# Patient Record
Sex: Female | Born: 1985 | Race: Black or African American | Hispanic: No | Marital: Married | Smoking: Never smoker
Health system: Southern US, Community
[De-identification: ages and names within clinical notes are randomized; demographics above are authoritative.]

## PROBLEM LIST (undated history)

## (undated) ENCOUNTER — Inpatient Hospital Stay (HOSPITAL_COMMUNITY): Payer: Self-pay

## (undated) DIAGNOSIS — J45909 Unspecified asthma, uncomplicated: Secondary | ICD-10-CM

---

## 2014-03-15 ENCOUNTER — Emergency Department (HOSPITAL_COMMUNITY): Payer: Medicaid Other

## 2014-03-15 ENCOUNTER — Emergency Department (HOSPITAL_COMMUNITY)
Admission: EM | Admit: 2014-03-15 | Discharge: 2014-03-15 | Disposition: A | Discharge status: Home / Self Care | Payer: Medicaid Other | Attending: Emergency Medicine | Admitting: Emergency Medicine

## 2014-03-15 ENCOUNTER — Encounter (HOSPITAL_COMMUNITY): Payer: Self-pay | Admitting: Cardiology

## 2014-03-15 DIAGNOSIS — K5900 Constipation, unspecified: Secondary | ICD-10-CM

## 2014-03-15 DIAGNOSIS — N390 Urinary tract infection, site not specified: Secondary | ICD-10-CM

## 2014-03-15 DIAGNOSIS — M25551 Pain in right hip: Secondary | ICD-10-CM | POA: Insufficient documentation

## 2014-03-15 DIAGNOSIS — R51 Headache: Secondary | ICD-10-CM | POA: Diagnosis not present

## 2014-03-15 DIAGNOSIS — G8929 Other chronic pain: Secondary | ICD-10-CM

## 2014-03-15 DIAGNOSIS — M545 Low back pain: Secondary | ICD-10-CM | POA: Insufficient documentation

## 2014-03-15 DIAGNOSIS — Z3202 Encounter for pregnancy test, result negative: Secondary | ICD-10-CM | POA: Insufficient documentation

## 2014-03-15 DIAGNOSIS — M549 Dorsalgia, unspecified: Secondary | ICD-10-CM

## 2014-03-15 DIAGNOSIS — R109 Unspecified abdominal pain: Secondary | ICD-10-CM | POA: Diagnosis present

## 2014-03-15 LAB — COMPREHENSIVE METABOLIC PANEL
ALT: 13 U/L (ref 0–35)
AST: 15 U/L (ref 0–37)
Albumin: 3.9 g/dL (ref 3.5–5.2)
Alkaline Phosphatase: 48 U/L (ref 39–117)
Anion gap: 6 (ref 5–15)
BUN: 13 mg/dL (ref 6–23)
CALCIUM: 9.3 mg/dL (ref 8.4–10.5)
CO2: 28 mmol/L (ref 19–32)
Chloride: 105 mEq/L (ref 96–112)
Creatinine, Ser: 0.52 mg/dL (ref 0.50–1.10)
GFR calc Af Amer: >90 mL/min (ref >90)
GFR calc non Af Amer: >90 mL/min (ref >90)
Glucose, Bld: 94 mg/dL (ref 70–99)
Potassium: 4.3 mmol/L (ref 3.5–5.1)
SODIUM: 139 mmol/L (ref 135–145)
TOTAL PROTEIN: 6.6 g/dL (ref 6.0–8.3)
Total Bilirubin: 0.4 mg/dL (ref 0.3–1.2)

## 2014-03-15 LAB — CBC WITH DIFFERENTIAL/PLATELET
BASOS ABS: 0.1 10*3/uL (ref 0.0–0.1)
Basophils Relative: 1 % (ref 0–1)
EOS ABS: 0.2 10*3/uL (ref 0.0–0.7)
EOS PCT: 2 % (ref 0–5)
HCT: 40.8 % (ref 36.0–46.0)
Hemoglobin: 13.1 g/dL (ref 12.0–15.0)
LYMPHS PCT: 46 % (ref 12–46)
Lymphs Abs: 3.1 10*3/uL (ref 0.7–4.0)
MCH: 27.8 pg (ref 26.0–34.0)
MCHC: 32.1 g/dL (ref 30.0–36.0)
MCV: 86.4 fL (ref 78.0–100.0)
Monocytes Absolute: 0.6 10*3/uL (ref 0.1–1.0)
Monocytes Relative: 8 % (ref 3–12)
Neutro Abs: 2.9 10*3/uL (ref 1.7–7.7)
Neutrophils Relative %: 43 % (ref 43–77)
Platelets: 367 10*3/uL (ref 150–400)
RBC: 4.72 MIL/uL (ref 3.87–5.11)
RDW: 12.9 % (ref 11.5–15.5)
WBC: 6.8 10*3/uL (ref 4.0–10.5)

## 2014-03-15 LAB — URINALYSIS, ROUTINE W REFLEX MICROSCOPIC
Bilirubin Urine: NEGATIVE
GLUCOSE, UA: NEGATIVE mg/dL
Ketones, ur: NEGATIVE mg/dL
Nitrite: NEGATIVE
PH: 7 (ref 5.0–8.0)
Protein, ur: NEGATIVE mg/dL
SPECIFIC GRAVITY, URINE: 1.026 (ref 1.005–1.030)
Urobilinogen, UA: 0.2 mg/dL (ref 0.0–1.0)

## 2014-03-15 LAB — URINE MICROSCOPIC-ADD ON

## 2014-03-15 LAB — PREGNANCY, URINE: Preg Test, Ur: NEGATIVE

## 2014-03-15 MED ORDER — CEPHALEXIN 500 MG PO CAPS: 500.0000 mg | ORAL_CAPSULE | Freq: Two times a day (BID) | ORAL | Status: DC

## 2014-03-15 NOTE — Discharge Instructions (Signed)
Please call your doctor for a followup appointment within 24-48 hours. When you talk to your doctor please let them know that you were seen in the emergency department and have them acquire all of your records so that they can discuss the findings with you and formulate a treatment plan to fully care for your new and ongoing problems. Please call and set-up an appointment with [facility]  Please rest and stay hydrated Please take antibiotics as prescribed and on a full stomach  Please continue to monitor symptoms closely and if symptoms are to worsen or change (fever greater than 101, chills, sweating, nausea, vomiting, chest pain, shortness of breathe, difficulty breathing, weakness, numbness, tingling, worsening or changes to pain pattern, fall, injury, inability to control urine or bowel movements) please report back to the Emergency Department immediately.   Urinary Tract Infection Urinary tract infections (UTIs) can develop anywhere along your urinary tract. Your urinary tract is your body's drainage system for removing wastes and extra water. Your urinary tract includes two kidneys, two ureters, a bladder, and a urethra. Your kidneys are a pair of bean-shaped organs. Each kidney is about the size of your fist. They are located below your ribs, one on each side of your spine. CAUSES Infections are caused by microbes, which are microscopic organisms, including fungi, viruses, and bacteria. These organisms are so small that they can only be seen through a microscope. Bacteria are the microbes that most commonly cause UTIs. SYMPTOMS  Symptoms of UTIs may vary by age and gender of the patient and by the location of the infection. Symptoms in young women typically include a frequent and intense urge to urinate and a painful, burning feeling in the bladder or urethra during urination. Older women and men are more likely to be tired, shaky, and weak and have muscle aches and abdominal pain.  A fever may mean the infection is in your kidneys. Other symptoms of a kidney infection include pain in your back or sides below the ribs, nausea, and vomiting. DIAGNOSIS To diagnose a UTI, your caregiver will ask you about your symptoms. Your caregiver also will ask to provide a urine sample. The urine sample will be tested for bacteria and white blood cells. White blood cells are made by your body to help fight infection. TREATMENT  Typically, UTIs can be treated with medication. Because most UTIs are caused by a bacterial infection, they usually can be treated with the use of antibiotics. The choice of antibiotic and length of treatment depend on your symptoms and the type of bacteria causing your infection. HOME CARE INSTRUCTIONS  If you were prescribed antibiotics, take them exactly as your caregiver instructs you. Finish the medication even if you feel better after you have only taken some of the medication.  Drink enough water and fluids to keep your urine clear or pale yellow.  Avoid caffeine, tea, and carbonated beverages. They tend to irritate your bladder.  Empty your bladder often. Avoid holding urine for long periods of time.  Empty your bladder before and after sexual intercourse.  After a bowel movement, women should cleanse from front to back. Use each tissue only once. SEEK MEDICAL CARE IF:   You have back pain.  You develop a fever.  Your symptoms do not begin to resolve within 3 days. SEEK IMMEDIATE MEDICAL CARE IF:   You have severe back pain or lower abdominal pain.  You develop chills.  You have nausea or vomiting.  You have  continued burning or discomfort with urination. MAKE SURE YOU:   Understand these instructions.  Will watch your condition.  Will get help right away if you are not doing well or get worse. Document Released: 12/19/2004 Document Revised: 09/10/2011 Document Reviewed: 04/19/2011 [facility] Patient Information 2015 [place], [place]. This  information is not intended to replace advice given to you by your health care provider. Make sure you discuss any questions you have with your health care provider.  Constipation Constipation is when a person has fewer than three bowel movements a week, has difficulty having a bowel movement, or has stools that are dry, hard, or larger than normal. As people grow older, constipation is more common. If you try to fix constipation with medicines that make you have a bowel movement (laxatives), the problem may get worse. Long-term laxative use may cause the muscles of the colon to become weak. A low-fiber diet, not taking in enough fluids, and taking certain medicines may make constipation worse.  CAUSES   Certain medicines, such as antidepressants, pain medicine, iron supplements, antacids, and water pills.   Certain diseases, such as diabetes, irritable bowel syndrome (IBS), thyroid disease, or depression.   Not drinking enough water.   Not eating enough fiber-rich foods.   Stress or travel.   Lack of physical activity or exercise.   Ignoring the urge to have a bowel movement.   Using laxatives too much.  SIGNS AND SYMPTOMS   Having fewer than three bowel movements a week.   Straining to have a bowel movement.   Having stools that are hard, dry, or larger than normal.   Feeling full or bloated.   Pain in the lower abdomen.   Not feeling relief after having a bowel movement.  DIAGNOSIS  Your health care provider will take a medical history and perform a physical exam. Further testing may be done for severe constipation. Some tests may include:  A barium enema X-ray to examine your rectum, colon, and, sometimes, your small intestine.   A sigmoidoscopy to examine your lower colon.   A colonoscopy to examine your entire colon. TREATMENT  Treatment will depend on the severity of your constipation and what is causing it. Some dietary treatments include drinking  more fluids and eating more fiber-rich foods. Lifestyle treatments may include regular exercise. If these diet and lifestyle recommendations do not help, your health care provider may recommend taking over-the-counter laxative medicines to help you have bowel movements. Prescription medicines may be prescribed if over-the-counter medicines do not work.  HOME CARE INSTRUCTIONS   Eat foods that have a lot of fiber, such as fruits, vegetables, whole grains, and beans.  Limit foods high in fat and processed sugars, such as french fries, hamburgers, cookies, candies, and soda.   A fiber supplement may be added to your diet if you cannot get enough fiber from foods.   Drink enough fluids to keep your urine clear or pale yellow.   Exercise regularly or as directed by your health care provider.   Go to the restroom when you have the urge to go. Do not hold it.   Only take over-the-counter or prescription medicines as directed by your health care provider. Do not take other medicines for constipation without talking to your health care provider first.  SEEK IMMEDIATE MEDICAL CARE IF:   You have bright red blood in your stool.   Your constipation lasts for more than 4 days or gets worse.   You have abdominal or rectal  pain.   You have thin, pencil-like stools.   You have unexplained weight loss. MAKE SURE YOU:   Understand these instructions.  Will watch your condition.  Will get help right away if you are not doing well or get worse. Document Released: 12/08/2003 Document Revised: 03/16/2013 Document Reviewed: 12/21/2012 [facility] Patient Information 2015 [place], [place]. This information is not intended to replace advice given to you by your health care provider. Make sure you discuss any questions you have with your health care provider.    Emergency Department Resource Guide 1) Find a Doctor and Pay Out of Pocket Although you won't have to find out who is covered by your  insurance plan, it is a good idea to ask around and get recommendations. You will then need to call the office and see if the doctor you have chosen will accept you as a new patient and what types of options they offer for patients who are self-pay. Some doctors offer discounts or will set up payment plans for their patients who do not have insurance, but you will need to ask so you aren't surprised when you get to your appointment.  2) Contact Your Local Health Department Not all health departments have doctors that can see patients for sick visits, but many do, so it is worth a call to see if yours does. If you don't know where your local health department is, you can check in your phone book. The CDC also has a tool to help you locate your state's health department, and many state websites also have listings of all of their local health departments.  3) Find a Walk-in Clinic If your illness is not likely to be very severe or complicated, you may want to try a walk in clinic. These are popping up all over the country in pharmacies, drugstores, and shopping centers. They're usually staffed by nurse practitioners or physician assistants that have been trained to treat common illnesses and complaints. They're usually fairly quick and inexpensive. However, if you have serious medical issues or chronic medical problems, these are probably not your best option.  No Primary Care Doctor: - Call Health Connect at  [phone] - they can help you locate a primary care doctor that  accepts your insurance, provides certain services, etc. - Physician Referral Service- [phone]  Chronic Pain Problems: Organization         Address  Phone   Notes  [facility]  [phone] Patients need to be referred by their primary care doctor.   Medication Assistance: Organization         Address  Phone   Notes  [facility] Medication [facility] [address] [place] [place], [place] [address] [phone] --Must be a resident of [facility] -- Must have NO insurance coverage whatsoever (no Medicaid/ Medicare, etc.) -- The pt. MUST have a primary care doctor that directs their care regularly and follows them in the community   [facility]ist  [phone]   [employer]  [phone]    Agencies that provide inexpensive medical care: Organization         Address  Phone   Notes  [facility]  [phone]   [facility]    [phone]   [facility] [address] [place], [place] [address] [phone]   [facility] [address] [place] [phone]   Planned Parenthood    (  [phone]   [facility]    [phone]   [facility]  [address], [place] Phone:  [phone], Fax:  [phone] Hours of Operation:  9 am - 6 pm, M-F.  Also accepts Medicaid/Medicare and self-pay.  [facility]  [address], [place] Phone: [phone], Fax: [phone]. Hours of Operation:  8:30 am - 5:30 pm, M-F.  Also accepts Medicaid and self-pay.  [facility] [place] [address], [place] Phone: [phone]   [facility] [address] Natasha Bence [place], [place] [phone], Ext. [phone] Mondays & Thursdays: 7-9 AM.  First 15 patients are seen on a first come, first serve basis.    Medicaid-accepting [facility] Providers:  Organization         Address  Phone   Notes  [facility] [address], [place] [phone] Also accepts self-pay patients.  [facility] [address] Laurell Josephs [place], [place]  [phone]   [facility] [address], [place] [phone]   [facility] [address] [place] [phone]   Renaye Rakers [address], [place]   [phone] Only accepts [place] patients after they have their name applied to their card.   Self-Pay (no insurance) in [facility]:  Organization         Address  Phone   Notes  Sickle Cell Patients, [facility] [address] [place], [place] [phone]   [facility] [address] [place], [place] [phone]   [facility] [place]  [address], [place] [phone]   [facility]/Dr. Osei-Bonsu  [address] [place] or [address], [place] [phone] Phone number for both [place] and [place] locations is the same.  [facility] [address], [place] [phone]   [facility] [address], [place] or [address] [phone]   [facility] [address] [place] [phone], phone; [phone], fax Sees patients 1st and 3rd Saturday of every month.  Must not qualify for public or private insurance (i.e. Medicaid, Medicare, [place] Health Choice, Veterans' Benefits)  Household income should be no more than 200% of the poverty level The clinic cannot treat you if you are pregnant or think you are pregnant  Sexually transmitted diseases are not treated at the clinic.    Dental Care: Organization         Address  Phone  Notes  [facility] Department of [facility] [address] [place], [place] [phone] Accepts children up to age 57 who are enrolled in [place] or [place] Health Choice; pregnant women with a Medicaid card; and children who have applied for Medicaid or [place] Health Choice, but were declined, whose parents can pay a reduced fee at time of service.  [facility] Department of [facility]  [address], [place] [phone] Accepts children up to age 57 who are enrolled  in [place] or [place] Health Choice; pregnant women with a Medicaid card; and children who have applied for Medicaid or  Health Choice, but were declined, whose parents can pay a  reduced fee at time of service.  [facility]  [address], [place] [phone] Patients are seen by appointment only. Walk-ins are not accepted. [facility] will see patients 27 years of age and older. Monday - Tuesday (8am-5pm) Most Wednesdays (8:30-5pm) $30 per visit, cash only  [facility]  [address], [place] [phone] Patients are seen by appointment only. Walk-ins are not accepted. [facility] will see patients 27 years of age and older. One Wednesday Evening (Monthly: Volunteer Based).  $30 per visit, cash only  [employer] [facility]  [phone] for adults; Children under age 214, call Graduate Pediatric Dentistry at [phone]) [phone]. Children aged 624-14, please call [phone] to request a pediatric application.  Dental services are provided in all areas of dental care including fillings, crowns and bridges, complete and partial dentures, implants, gum treatment, root canals, and extractions. Preventive care is also provided. Treatment is provided to both adults and children. Patients are selected via a lottery and there is often a waiting list.   [facility] [address], [place]  [phone] [email or web address]   [facility] [address], [place], [place] [phone], Ext. [phone] Second and Fourth Thursday of each month, opens at 6:30 AM; Clinic ends at 9 AM.  Patients are seen on a first-come first-served basis, and a limited number are seen during each clinic.   [facility]  [address], [place], [place] [phone]   Eligibility Requirements You must have lived in [place], [place], or [place] counties for at least the last three months.   You cannot be  eligible for state or federal sponsored [place] Cityhealthcare insurance, including CIGNAVeterans Administration, [place]icaid, or [employer]icare.   You generally cannot be eligible for healthcare insurance through your employer.    How to apply: Eligibility screenings are held every Tuesday and Wednesday afternoon from 1:00 pm until 4:00 pm. You do not need an appointment for the interview!  [facility] [address], [place], [place] [phone]   [facility]  [phone]   [facility]  [phone]   [facility]  [phone]    Behavioral Health Resources in the Community: Intensive Outpatient Programs Organization         Address  Phone  Notes  [facility] [address], [place], [place] [phone]   [facility] [address], [place], [place] [phone]   [facility] [address], [place], [place]  [phone]   [facility] [address],  [place], [place] [phone] or [phone]   Substance Abuse Resources Organization         Address  Phone  Notes  [facility]  [phone]   [facility]  [phone]   [facility]  [phone]   [facility]  [phone]   Residential & Outpatient Substance Abuse Program  [phone]   Psychological Services Organization         Address  Phone  Notes  [facility]  [phone]   [facility]  [phone]   [facility] [address], [place] [phone] or [phone]    Mobile Crisis Teams Organization         Address  Phone  Notes  [facility], Mobile Crisis Care Unit  [phone]  [facility]  [address] [place], [place] [phone]   [facility] [address] [place] [phone]    Self-Help/Support Groups Organization          Address  Phone             Notes  [facility] [place] - variety of support groups  [phone]- [phone] Call for more information  Narcotics Anonymous (NA), [facility] [address], [place]  2 meetings at this location   Statisticianesidential Treatment Programs Organization         Address  Phone  Notes  [facility] [address],    [place]  [phone]   [facility]  [address], [place] [phone], [place], [place] [phone]   [facility] [address], [place] [phone] Admissions: 8am-3pm M-F  [facility] [address]    [place], [place] [phone]   [facility] [address], [place], [place] [phone]   [facility] [address]  [place], [place] [phone]   [facility] - Intensive Outpatient [address], [place], [place] [phone]   [facility] ([facility] [address]  [place], [place] [phone] or [phone]   [facility] ([facility]) [address] [place], [place] [phone] Accepts Medicaid  [facility] [address]  [place] [phone] Substance Abuse/Addiction Treatment   [facility] Resources Organization         Address  Phone  Notes  [facility]  [phone]) [phone]   Angie FavaJulie Brannon, PhD [address], [address] [place], [place]   [phone] or [phone]   [facility]   [address] [place], [place] [phone]   [facility] [address], [place], [place] [phone] Insurance/Medicaid/sponsorship through [facility]  [facility] [address]                                    [place], [place] [phone] Therapy/tele-psych/case  [facility] [address]   [place], [place] [phone]    Dr. Lolly MustacheArfeen  [phone]   [facility]  [employer]  [facility] 1) [address], [place] 2) [address], [place] 3)  [address], [place] [phone]  [phone]   [facility] Child Abuse Hotline [phone]) [phone] or [phone] (After Hours)

## 2014-03-15 NOTE — ED Notes (Signed)
Patient also reports she has had a headache for the past 2 days but has not tried any otc meds for it. She denies any n/v/d. Denies fever.
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
## 2014-03-15 NOTE — ED Notes (Signed)
Patient transported to X-ray 

## 2014-03-15 NOTE — ED Notes (Signed)
Pt reports right flank pain that started this morning about 0700. Reports a headache, but denies any n/v or painful urination.

## 2014-03-15 NOTE — ED Notes (Signed)
Case Manager Number is [phone].  Ms. Christina Vega.  She will  be picking her up.

## 2014-03-15 NOTE — ED Notes (Signed)
Patient is alert and orientedx4.  Patient was explained discharge instructions and they understood them with no questions.  The patient's husband, Mammie RussianJuma Juma is taking her home.

## 2014-03-15 NOTE — ED Notes (Signed)
Family at bedside. 

## 2014-03-15 NOTE — ED Notes (Addendum)
CALLED LAB TO CHECK ON PREG RESULT. HAS BEEN PENDING FOR ABOUT 30 MIN. LAB ADVISES RESULT IS NEGATIVE. WILL POST RESULT TO CHART

## 2014-03-16 LAB — URINE CULTURE
Colony Count: >=100000
SPECIAL REQUESTS: NORMAL

## 2014-07-04 ENCOUNTER — Encounter: Payer: Self-pay | Admitting: Family Medicine

## 2014-07-04 ENCOUNTER — Ambulatory Visit (INDEPENDENT_AMBULATORY_CARE_PROVIDER_SITE_OTHER): Payer: Medicaid Other | Admitting: Family Medicine

## 2014-07-04 VITALS — BP 109/50 | HR 66 | Temp 97.8°F | Ht 65.0 in | Wt 191.6 lb

## 2014-07-04 DIAGNOSIS — Z008 Encounter for other general examination: Secondary | ICD-10-CM | POA: Diagnosis not present

## 2014-07-04 DIAGNOSIS — L905 Scar conditions and fibrosis of skin: Secondary | ICD-10-CM | POA: Diagnosis not present

## 2014-07-04 DIAGNOSIS — Z23 Encounter for immunization: Secondary | ICD-10-CM

## 2014-07-04 DIAGNOSIS — Z789 Other specified health status: Secondary | ICD-10-CM | POA: Insufficient documentation

## 2014-07-04 DIAGNOSIS — F515 Nightmare disorder: Secondary | ICD-10-CM

## 2014-07-04 DIAGNOSIS — Z0289 Encounter for other administrative examinations: Secondary | ICD-10-CM | POA: Insufficient documentation

## 2014-07-04 DIAGNOSIS — R52 Pain, unspecified: Secondary | ICD-10-CM

## 2014-07-04 MED ORDER — ACETAMINOPHEN 500 MG PO TABS: 500.0000 mg | ORAL_TABLET | Freq: Four times a day (QID) | ORAL | Status: DC | PRN

## 2014-07-04 NOTE — Assessment & Plan Note (Addendum)
Pain over c-section scar. Trial OTC pain management with tylenol, pt states had good relief in past with belt, given name of maternity belt on AVS to pay out of pocket at pharmacy. Abdominal exam benign today.  No evidence of intra-abdominal pathology.

## 2014-07-04 NOTE — Patient Instructions (Signed)
Abdominal pain:  Maternity belt, you will need to buy this on your own as insurance won't cover it. The pharmacy or Home health equipment store should have this.  Tylenol (Acetaminophen or Paracetamol): Take 500mg  every 6 hours as needed for pain  If you continue to have pain schedule another appointment.
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
## 2014-07-04 NOTE — Assessment & Plan Note (Signed)
Seen at health department, will need to request health records.

## 2014-07-05 DIAGNOSIS — F515 Nightmare disorder: Secondary | ICD-10-CM | POA: Insufficient documentation
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
## 2014-07-28 ENCOUNTER — Ambulatory Visit: Payer: Medicaid Other | Admitting: Family Medicine

## 2014-07-29 ENCOUNTER — Encounter: Payer: Self-pay | Admitting: Family Medicine

## 2014-07-29 ENCOUNTER — Ambulatory Visit (INDEPENDENT_AMBULATORY_CARE_PROVIDER_SITE_OTHER): Payer: Medicaid Other | Admitting: Family Medicine

## 2014-07-29 VITALS — BP 106/54 | HR 70 | Temp 98.5°F | Wt 193.0 lb

## 2014-07-29 DIAGNOSIS — R201 Hypoesthesia of skin: Secondary | ICD-10-CM | POA: Diagnosis present

## 2014-07-29 LAB — VITAMIN B12: Vitamin B-12: 561 pg/mL (ref 211–911)

## 2014-07-29 LAB — FOLATE: Folate: >20 ng/mL

## 2014-07-29 LAB — TSH: TSH: 0.862 u[IU]/mL (ref 0.350–4.500)

## 2014-07-29 MED ORDER — GABAPENTIN 100 MG PO CAPS: 100.0000 mg | ORAL_CAPSULE | Freq: Three times a day (TID) | ORAL | Status: DC

## 2014-07-29 NOTE — Assessment & Plan Note (Signed)
B/L Feet with subjective hypoesthesias/paresthesias.  However, she is able to walk along with negative Romberg which means she must feel something.  Negative Tarsal Tunnel as well - lab work to include RPR, B12, A1C, TSH to r/o organic causes - Start on neurontin 100 mg TID and if no improvement in 1-2 weeks, consider EMG.

## 2014-07-29 NOTE — Progress Notes (Signed)
I was preceptor for this office visit.  

## 2014-07-29 NOTE — Progress Notes (Signed)
Christina BoroughsRaniya Vega is a 29 y.o. female who presents today for B/L foot numbness  Foot hypoesthesias/paresthesias - States this has been ongoing now for two weeks.  She denies ever having this before, has not tried anything for this, denies weakness.  States that she gets "full foot numbness/loss of sensation" in episodes throughout the days.  Has not gotten worse but has not gotten better.  Denies back pain, muscle weakness, bowel/bladder issues, polyuria/polydipsia, fall issues.   No past medical history on file.  History  Smoking status  . Never Smoker   Smokeless tobacco  . Not on file    No family history on file.  Current Outpatient Prescriptions on File Prior to Visit  Medication Sig Dispense Refill  . acetaminophen (TYLENOL) 500 MG tablet Take 1 tablet (500 mg total) by mouth every 6 (six) hours as needed for mild pain or moderate pain. 90 tablet 0   No current facility-administered medications on file prior to visit.    ROS: Per HPI.  All other systems reviewed and are negative.   Physical Exam Filed Vitals:   07/29/14 1020  BP: 106/54  Pulse: 70  Temp: 98.5 F (36.9 C)    Physical Examination: General appearance - alert, well appearing, and in no distress Neurological - AAO x 3.  Negative Romberg/Pronator drift.  MS 5/5 B/L LE.  Subjective loss of sensation L4-S1 dermatomes B/L feet.  Negative Tarsal Tunnel Tinel's B/L.  No ataxia or gait disturbances.  Musculoskeletal - no joint tenderness, deformity or swelling Skin: No evidence of abnormal rashes or skin changes on ankles/feet    Chemistry      Component Value Date/Time   NA 139 03/15/2014 1328   K 4.3 03/15/2014 1328   CL 105 03/15/2014 1328   CO2 28 03/15/2014 1328   BUN 13 03/15/2014 1328   CREATININE 0.52 03/15/2014 1328      Component Value Date/Time   CALCIUM 9.3 03/15/2014 1328   ALKPHOS 48 03/15/2014 1328   AST 15 03/15/2014 1328   ALT 13 03/15/2014 1328   BILITOT 0.4 03/15/2014 1328      Lab  Results  Component Value Date   WBC 6.8 03/15/2014   HGB 13.1 03/15/2014   HCT 40.8 03/15/2014   MCV 86.4 03/15/2014   PLT 367 03/15/2014

## 2014-07-30 LAB — RPR

## 2014-08-01 ENCOUNTER — Encounter: Payer: Self-pay | Admitting: Family Medicine

## 2014-08-10 ENCOUNTER — Encounter: Payer: Self-pay | Admitting: Family Medicine

## 2014-08-10 ENCOUNTER — Ambulatory Visit (INDEPENDENT_AMBULATORY_CARE_PROVIDER_SITE_OTHER): Payer: Medicaid Other | Admitting: Family Medicine

## 2014-08-10 VITALS — BP 110/61 | HR 65 | Temp 97.6°F | Ht 65.0 in | Wt 198.0 lb

## 2014-08-10 DIAGNOSIS — R201 Hypoesthesia of skin: Secondary | ICD-10-CM

## 2014-08-10 DIAGNOSIS — G629 Polyneuropathy, unspecified: Secondary | ICD-10-CM | POA: Diagnosis not present

## 2014-08-10 LAB — HIV ANTIBODY (ROUTINE TESTING W REFLEX): HIV 1&2 Ab, 4th Generation: NONREACTIVE

## 2014-08-10 LAB — POCT GLYCOSYLATED HEMOGLOBIN (HGB A1C): Hemoglobin A1C: 5.6

## 2014-08-10 NOTE — Patient Instructions (Addendum)
For the foot numbness, I am putting in a referral to neurology for an EMG and you should receive a phone call to schedule this appointment. We are also checking some lab work today and I will call if it is not normal. Otherwise I will send a letter. For the mild leg swelling sensation, keep your legs elevated a few times a day to help with swelling, and consider buying compression socks. This may help with some of the numbness. Schedule follow-up appointment with us for a few days to one week after your EMG. If you have severe pain, weakness, or other major concerns, seek immediate care. I was unable to find any vitamin D results. It may be worthwhile to make sure that Dr. Waynetta SandyWight is notified of this and has any lab work and evaluation they did at the health department.  Christina SingletonMaria T Jatziri Goffredo, MD

## 2014-08-10 NOTE — Progress Notes (Addendum)
Patient ID: Jennye Boroughsaniya Klepacki, female   DOB: 12/29/1985, 29 y.o.   MRN: [id number] Subjective:   CC: Evaluation of lower extremity numbness  HPI:   Patient presents for follow-up of lower externally numbness. Patient presented to clinic 07/29/2014 for bilateral foot numbness. On exam, this was thought to be hypoesthesia of skin and patient had normal neurologic exam with negative Romberg suggesting that she did feel something in her feet. Negative tarsal tunnel as well. Lab work included RPR, B-12, folate, and TSH were negative. She was started on Neurontin 100 mg 3 times a day.  Since then, she states that numbness has worsened since presentation 2 mo ago, now involving both knees. She denies leg/foot pain, rash or other skin changes, other symptoms, neurologic issues with weakness or gait changes other than having a stiff gait for a few minutes after standing from being seated for a while. She also denies any new trauma or lifting but reports that 5 months ago she lifted something heavy and now has mid lower back pain with no radiation. Numbness is intermittent and has no apparent pattern. She thinks she has had some bilateral foot and leg swelling. She denies any incontinence of urine or stool or perineal numbness or tingling. She also denies shortness of breath, PND, or orthopnea.  Review of Systems - Per HPI.   PMH - surgical scar pain, nightmares, hypoesthesia of skin, refugee  FH: Mother with arthritis, no neurologic problems in family members No alcohol or drug use    Objective:  Physical Exam BP 110/61 mmHg  Pulse 65  Temp(Src) 97.6 F (36.4 C) (Oral)  Ht 5\' 5"  (1.651 m)  Wt 198 lb (89.812 kg)  BMI 32.95 kg/m2 GEN: NAD, well-developed obese female Cardiovascular: Regular rate and rhythm, no murmurs rubs or gallops, 2+ bilateral dorsalis pedis pulses Pulmonary: Clear to auscultation bilaterally, normal effort Abdomen: Soft, nontender, nondistended, obese HEENT: Atraumatic,  normocephalic, sclera clear, no thyromegaly appreciated Extremities: No lower extremity edema or calf tenderness, mild generalized tenderness bilateral knees, legs, and feet with tenderness noted at left lateral malleolus and right lateral joint line of the knee, no atrophy, negative straight leg raise bilaterally NEURO: Normal tone, no atrophy in bilateral feet, decreased sensation to light touch in stocking pattern anterior and posterior feet bilaterally to level of malleoli, and anterior knees bilaterally, 5/5 bilateral ankle dorsiflexion and plantar flexion; Normal bilateral symmetric patellar and Achilles reflexes, Normal gait Back: Moderate tenderness at mid to low lumbar, specifically in right paraspinal muscles, no hypertrophy, normal ROM to flexion and extension  SKIN: No rash or cyanosis, no acanthosis nigricans    Assessment:     Jennye BoroughsRaniya Fiala is a 29 y.o. female here for LE numbness.    Plan:     # See problem list and after visit summary for problem-specific plans.   Follow-up: Follow up in 1 week after EMG.    Leona SingletonMaria T Brynli Ollis, MD [facility]

## 2014-08-13 ENCOUNTER — Encounter: Payer: Self-pay | Admitting: Family Medicine

## 2014-08-13 NOTE — Assessment & Plan Note (Addendum)
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 Subjective leg swelling bilaterally making pain and abnormal sensation from this possible. Also with some degenerative symptoms in lateral knees. RPR, B12, folate, TSH normal.  Recent CBC and CMET normal 02/2014. No rash or reported bug bite. -Referral to neurology for LE bilateral EMG -A1c, HIV -Elevate legs, wear compression hose, continue gabapentin -F/u after EMG. -Immediate eval if severe pain, fevers, chills, swelling or other changes. -Precepted with Dr Randolm IdolFletke.

## 2014-08-15 NOTE — Progress Notes (Signed)
I was the preceptor on the day of this visit.   Louis Gaw MD  

## 2014-08-15 NOTE — Progress Notes (Signed)
I was preceptor the day of this visit.   

## 2014-08-29 ENCOUNTER — Ambulatory Visit (INDEPENDENT_AMBULATORY_CARE_PROVIDER_SITE_OTHER): Payer: Self-pay | Admitting: Neurology

## 2014-08-29 ENCOUNTER — Encounter: Payer: Self-pay | Admitting: Neurology

## 2014-08-29 ENCOUNTER — Ambulatory Visit (INDEPENDENT_AMBULATORY_CARE_PROVIDER_SITE_OTHER): Payer: Medicaid Other | Admitting: Neurology

## 2014-08-29 DIAGNOSIS — R201 Hypoesthesia of skin: Secondary | ICD-10-CM

## 2014-08-29 NOTE — Procedures (Signed)
     HISTORY:  Christina BoroughsRaniya Maddix is a 29 year old patient with a two-month history of numbness in the legs bilaterally, from the knees down to the feet. The patient does also report some low back pain. She denies any significant weakness of the legs. She is sent over for further evaluation of the new symptoms.  NERVE CONDUCTION STUDIES:  Nerve conduction studies were performed on both lower extremities. The distal motor latencies and motor amplitudes for the peroneal and posterior tibial nerves were within normal limits. The nerve conduction velocities for these nerves were also normal. The H reflex latencies were normal. The sensory latencies for the peroneal nerves were within normal limits.   EMG STUDIES:  EMG study was performed on the right lower extremity:  The tibialis anterior muscle reveals 2 to 4K motor units with full recruitment. No fibrillations or positive waves were seen. The peroneus tertius muscle reveals 2 to 4K motor units with full recruitment. No fibrillations or positive waves were seen. The medial gastrocnemius muscle reveals 1 to 3K motor units with full recruitment. No fibrillations or positive waves were seen. The vastus lateralis muscle reveals 2 to 4K motor units with full recruitment. No fibrillations or positive waves were seen. The iliopsoas muscle reveals 2 to 4K motor units with full recruitment. No fibrillations or positive waves were seen. The biceps femoris muscle (long head) reveals 2 to 4K motor units with full recruitment. No fibrillations or positive waves were seen. The lumbosacral paraspinal muscles were tested at 3 levels, and revealed no abnormalities of insertional activity at all 3 levels tested. There was good relaxation.  EMG study was performed on the left lower extremity:  The tibialis anterior muscle reveals 2 to 4K motor units with full recruitment. No fibrillations or positive waves were seen. The peroneus tertius muscle reveals 2 to 4K motor  units with full recruitment. No fibrillations or positive waves were seen. The medial gastrocnemius muscle reveals 1 to 3K motor units with full recruitment. No fibrillations or positive waves were seen. The vastus lateralis muscle reveals 2 to 4K motor units with full recruitment. No fibrillations or positive waves were seen. The iliopsoas muscle reveals 2 to 4K motor units with full recruitment. No fibrillations or positive waves were seen. The biceps femoris muscle (long head) reveals 2 to 4K motor units with full recruitment. No fibrillations or positive waves were seen. The lumbosacral paraspinal muscles were tested at 3 levels, and revealed no abnormalities of insertional activity at all 3 levels tested. There was good relaxation.   IMPRESSION:  Nerve conduction studies done on both lower extremities were within normal limits. No evidence of a peripheral neuropathy is seen. EMG evaluation of both lower extremities were unremarkable, without evidence of an overlying lumbosacral radiculopathy on either side.  Marlan Palau. Keith Willis MD 08/29/2014 2:42 PM  [facility] [address] [place], [place] [id number]  Phone [phone] Fax [phone]

## 2014-08-29 NOTE — Progress Notes (Signed)
Please refer to EMG and nerve conduction study procedure note. 

## 2015-01-06 ENCOUNTER — Encounter: Payer: Self-pay | Admitting: Family Medicine

## 2015-01-06 ENCOUNTER — Ambulatory Visit (INDEPENDENT_AMBULATORY_CARE_PROVIDER_SITE_OTHER): Payer: Medicaid Other | Admitting: Family Medicine

## 2015-01-06 VITALS — Temp 98.2°F | Ht 65.0 in | Wt 197.0 lb

## 2015-01-06 DIAGNOSIS — R201 Hypoesthesia of skin: Secondary | ICD-10-CM

## 2015-01-06 DIAGNOSIS — Z23 Encounter for immunization: Secondary | ICD-10-CM

## 2015-01-06 MED ORDER — DULOXETINE HCL 20 MG PO CPEP: 20.0000 mg | ORAL_CAPSULE | Freq: Every day | ORAL | Status: DC

## 2015-01-06 NOTE — Patient Instructions (Addendum)
Start taking the Duloxetine (Cymbalta)  once a day.  If in 2 weeks you are not seeing any improvement, start taking  (2 tablets once a day).   Follow up with me in 4-6 weeks.  Influenza Virus Vaccine (Flucelvax) What is this medicine? INFLUENZA VIRUS VACCINE (in floo EN zuh VAHY ruhs vak SEEN) helps to reduce the risk of getting influenza also known as the flu. The vaccine only helps protect you against some strains of the flu. This medicine may be used for other purposes; ask your health care provider or pharmacist if you have questions. What should I tell my health care provider before I take this medicine? They need to know if you have any of these conditions: -bleeding disorder like hemophilia -fever or infection -Guillain-Barre syndrome or other neurological problems -immune system problems -infection with the human immunodeficiency virus (HIV) or AIDS -low blood platelet counts -multiple sclerosis -an unusual or allergic reaction to influenza virus vaccine, other medicines, foods, dyes or preservatives -pregnant or trying to get pregnant -breast-feeding How should I use this medicine? This vaccine is for injection into a muscle. It is given by a health care professional. A copy of Vaccine Information Statements will be given before each vaccination. Read this sheet carefully each time. The sheet may change frequently. Talk to your pediatrician regarding the use of this medicine in children. Special care may be needed. Overdosage: If you think you've taken too much of this medicine contact a poison control center or emergency room at once. Overdosage: If you think you have taken too much of this medicine contact a poison control center or emergency room at once. NOTE: This medicine is only for you. Do not share this medicine with others. What if I miss a dose? This does not apply. What may interact with this medicine? -chemotherapy or radiation therapy -medicines that  lower your immune system like etanercept, anakinra, infliximab, and adalimumab -medicines that treat or prevent blood clots like warfarin -phenytoin -steroid medicines like prednisone or cortisone -theophylline -vaccines This list may not describe all possible interactions. Give your health care provider a list of all the medicines, herbs, non-prescription drugs, or dietary supplements you use. Also tell them if you smoke, drink alcohol, or use illegal drugs. Some items may interact with your medicine. What should I watch for while using this medicine? Report any side effects that do not go away within 3 days to your doctor or health care professional. Call your health care provider if any unusual symptoms occur within 6 weeks of receiving this vaccine. You may still catch the flu, but the illness is not usually as bad. You cannot get the flu from the vaccine. The vaccine will not protect against colds or other illnesses that may cause fever. The vaccine is needed every year. What side effects may I notice from receiving this medicine? Side effects that you should report to your doctor or health care professional as soon as possible: -allergic reactions like skin rash, itching or hives, swelling of the face, lips, or tongue Side effects that usually do not require medical attention (Report these to your doctor or health care professional if they continue or are bothersome.): -fever -headache -muscle aches and pains -pain, tenderness, redness, or swelling at the injection site -tiredness This list may not describe all possible side effects. Call your doctor for medical advice about side effects. You may report side effects to FDA at 1-800-FDA-1088. Where should I keep my medicine? The vaccine will be  given by a health care professional in a clinic, pharmacy, doctor's office, or other health care setting. You will not be given vaccine doses to store at home. NOTE: This sheet is a summary. It may  not cover all possible information. If you have questions about this medicine, talk to your doctor, pharmacist, or health care provider.    2016, Elsevier/Gold Standard. (2011-02-20 14:06:47)

## 2015-01-09 NOTE — Progress Notes (Signed)
   Subjective:    Patient ID: Christina Vega, female    DOB: 04/23/1985, 29 y.o.   MRN: [id number]  HPI  Attempted to use video interpreter x 3, disconnected each time within 1-2 minutes, patient preferred to finish encounter without one (has fairly good [place])  CC: pain in lower feet  # Lower feet pain:  Seen for this before  Gets a burning type pain along with some numbness of the entirety of both feet up to the ankles  Pain is getting worse recently, particularly with standing. She wants to get a job but feels she won't be able to stand for it  Tried gabapentin but didn't seem to help  Present since she arrived to [place] in February, did not have issues before that  Denies any trauma to the feet  Usually wears supportive footwear/sneakers  Has had normal testing including RPR, B12, folate, TSH, nerve conduction studies ROS: no fevers, nausea, no headaches, no bowel/bladder incontinence  Social Hx: refugee, moved to [place] this year. Has a history of nightmares  Review of Systems   See HPI for ROS.   Past medical history, surgical, family, and social history reviewed and updated in the EMR as appropriate. Objective:  Temp(Src) 98.2 F (36.8 C) (Oral)  Ht 5\' 5"  (1.651 m)  Wt 197 lb (89.359 kg)  BMI 32.78 kg/m2 Vitals and nursing note reviewed  General: NAD CV: RRR, normal s1s2, no murmurs/rub/gallop, 2+ radial, PT pulses bilaterally  Resp: clear to auscultation bilaterally, normal effort Ext: no lower extremity edema, both feet are warm and no cyanosis of distal extremities. There is generalized tenderness of both feet including plantar and dorsal surfaces. There is no deformity noted. Neuro: alert and oriented, no focal deficits. Ankle/foot strength normal. Gait is normal. Psych: mood is normal, affect is congruent. Normal thought content and speech.  Assessment & Plan:  Hypoesthesia of skin Persistent/not improved. Workup thus far including labs (B12, RPR, folate,  TSH) and nerve conduction studies; all normal. Presentation upon arrival to the [place] would make somatiform issue more likely. She does not have any apparent reason for a neuropathy (no Diabetes). Reassuring is that the symptoms have not ascended up the legs. Will do a trial of duloxetine 20mg , instructed if doing well can uptitrate this in between next visit to 40mg , follow up 6 weeks.

## 2015-01-09 NOTE — Assessment & Plan Note (Signed)
Persistent/not improved. Workup thus far including labs (B12, RPR, folate, TSH) and nerve conduction studies; all normal. Presentation upon [place] to the [place] would make somatiform issue more likely. She does not have any apparent reason for a neuropathy (no Diabetes). Reassuring is that the symptoms have not ascended up the legs. Will do a trial of duloxetine 20mg , instructed if doing well can uptitrate this in between next visit to 40mg , follow up 6 weeks.

## 2015-02-20 ENCOUNTER — Encounter: Payer: Self-pay | Admitting: Family Medicine

## 2015-02-20 ENCOUNTER — Ambulatory Visit (INDEPENDENT_AMBULATORY_CARE_PROVIDER_SITE_OTHER): Payer: Medicaid Other | Admitting: Family Medicine

## 2015-02-20 VITALS — BP 116/68 | HR 87 | Temp 97.9°F | Ht 65.0 in | Wt 200.0 lb

## 2015-02-20 DIAGNOSIS — M62830 Muscle spasm of back: Secondary | ICD-10-CM

## 2015-02-20 DIAGNOSIS — R201 Hypoesthesia of skin: Secondary | ICD-10-CM | POA: Diagnosis not present

## 2015-02-20 MED ORDER — BACLOFEN 10 MG PO TABS: 10.0000 mg | ORAL_TABLET | Freq: Three times a day (TID) | ORAL | Status: DC

## 2015-02-20 NOTE — Patient Instructions (Signed)
Take the cymbalta once a day as you have been. We can increase this in the future if you need to.  Take the baclofen which is a muscle relaxer, up to 3 times a day.   Start doing the exercises in the printout.   You can also add a warm compress (something warm to put on the back) a few times a day.
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
## 2015-02-21 DIAGNOSIS — M62830 Muscle spasm of back: Secondary | ICD-10-CM | POA: Insufficient documentation

## 2015-02-21 NOTE — Assessment & Plan Note (Signed)
Improved/resolved symptoms on initial cymbalta. She would like to stay on the current dose of 20mg  daily. Follow up as needed.

## 2015-02-21 NOTE — Assessment & Plan Note (Addendum)
Paraspinal muscle spasms noted on exam today. Trial baclofen. Given HEP printout. Follow up as needed/if not improving over next 6 weeks.

## 2015-06-09 ENCOUNTER — Ambulatory Visit (INDEPENDENT_AMBULATORY_CARE_PROVIDER_SITE_OTHER): Payer: Medicaid Other | Admitting: Internal Medicine

## 2015-06-09 ENCOUNTER — Encounter: Payer: Self-pay | Admitting: Internal Medicine

## 2015-06-09 VITALS — BP 127/71 | HR 112 | Temp 101.7°F | Wt 207.9 lb

## 2015-06-09 DIAGNOSIS — M791 Myalgia, unspecified site: Secondary | ICD-10-CM

## 2015-06-09 DIAGNOSIS — B349 Viral infection, unspecified: Secondary | ICD-10-CM | POA: Diagnosis not present

## 2015-06-09 LAB — INFLUENZA PANEL BY PCR (TYPE A & B)
H1N1 flu by pcr: NOT DETECTED
Influenza A By PCR: NEGATIVE
Influenza B By PCR: NEGATIVE

## 2015-06-09 NOTE — Progress Notes (Signed)
   [facility] Phone: [phone]  Subjective:  She had a fever, headache, muscle pains, dizziness, chills, and sore throat starting yesterday. She has not tried taking any medications at home. No sick contacts. She has had nausea, but no vomiting or diarrhea. She got a flu shot this year. She hasn't eaten anything. She has been able to drink juice.  LMP in the middle of February. She states there is no way she could be pregnant.  ROS: See HPI for pertinent positives and negatives Past Medical History- none Reviewed problem list.  Medications- reviewed and updated Current Outpatient Prescriptions  Medication Sig Dispense Refill  . acetaminophen (TYLENOL) 500 MG tablet Take 1 tablet (500 mg total) by mouth every 6 (six) hours as needed for mild pain or moderate pain. 90 tablet 0   No current facility-administered medications for this visit.   Chief complaint-noted Family history reviewed for today's visit. No changes. Social history- patient is a never smoker  Objective: BP 127/71 mmHg  Pulse 112  Temp(Src) 101.7 F (38.7 C) (Oral)  Wt 207 lb 14.4 oz (94.303 kg) Gen: Ill-appearing, but in NAD HEENT: NCAT, EOMI, MMM, TMs clear, oropharynx erythematous without exudate Neck: FROM, supple, no cervical lymphadenopathy CV: Mildly tachycardic, regular rhythm, no murmur, brisk cap refill Resp: CTABL, no wheezes, normal work of breathing GI: Soft, BS present, generalized tenderness to palpation, no guarding or organomegaly Msk: No edema, warm, normal tone, moves UE/LE spontaneously Neuro: Alert and oriented, no gross deficits Skin: No rashes, no lesions Psych: Appropriate behavior  Assessment/Plan: Viral illness: Pt with fever, chills, headache, generalized muscle aches, and sore throat x 1 day. Likely flu vs other viral illness. No signs of bacterial infection on exam. - Will get flu test today - If flu is positive, will prescribe Tamiflu, as her symptoms  have only been present for 1 day - Advised her to drink plenty of fluids - Return precautions given - Follow-up in 1 week if not improved   Willadean CarolKaty Jamarr Treinen, MD PGY-1

## 2015-06-09 NOTE — Patient Instructions (Addendum)
I hope you start feeling better soon!  I think you have the flu. We will test you today and I will call you with the results. If you have the flu, I will send a medication call Tamiflu into the pharmacy.   Please make sure you are drinking as much as possible. You can drink water, juice, broth, or whatever else you feel like drinking.  You can also use Ibuprofen and Tylenol to help with the muscle pain and sore throat.  -Dr. Nancy MarusMayo

## 2015-06-09 NOTE — Assessment & Plan Note (Signed)
Pt with fever, chills, headache, generalized muscle aches, and sore throat x 1 day. Likely flu vs other viral illness. No signs of bacterial infection on exam. - Will get flu test today - If flu is positive, will prescribe Tamiflu, as her symptoms have only been present for 1 day - Advised her to drink plenty of fluids - Return precautions given - Follow-up in 1 week if not improved

## 2015-08-23 ENCOUNTER — Ambulatory Visit (INDEPENDENT_AMBULATORY_CARE_PROVIDER_SITE_OTHER): Payer: Medicaid Other | Admitting: Student

## 2015-08-23 ENCOUNTER — Encounter: Payer: Self-pay | Admitting: Student

## 2015-08-23 VITALS — BP 108/61 | HR 68 | Temp 98.8°F | Wt 200.6 lb

## 2015-08-23 DIAGNOSIS — R55 Syncope and collapse: Secondary | ICD-10-CM

## 2015-08-23 NOTE — Patient Instructions (Signed)
Follow up with PCP as needed You likely had dizziness from standing for a prolonged amount of time. A note was given to you for work to request note for work was given to you for at least 3 short breaks per shift.  If you feel worsening dizziness, call the office to be seen.  If you have any questions or concerns, call the office at [phone]

## 2015-08-24 DIAGNOSIS — R55 Syncope and collapse: Secondary | ICD-10-CM | POA: Insufficient documentation

## 2015-08-24 NOTE — Assessment & Plan Note (Signed)
History consistent with vasovagal syncope like event. Likely exacerbated by long periods of standing for work - note given to allow at least three 20 min breaks per shift - will evaluate further if she actually has LOC or repeated episodes -  Continue to follow as needed
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
## 2015-08-25 ENCOUNTER — Telehealth: Payer: Self-pay | Admitting: Family Medicine

## 2015-08-25 NOTE — Telephone Encounter (Signed)
Patient requests PCP for a detailed work note according with her employer's requirements. Please, follow up ASAP.

## 2015-08-25 NOTE — Telephone Encounter (Signed)
Revised letter to employer completed and for fax

## 2015-08-25 NOTE — Telephone Encounter (Signed)
Will forward to Dr. Kennon RoundsHaney who saw patient for this visit and initially wrote her work note.  Note with requirements from work has been placed in provider's box. Jazmin Hartsell,CMA

## 2015-08-28 ENCOUNTER — Telehealth: Payer: Self-pay | Admitting: Family Medicine

## 2015-08-28 ENCOUNTER — Ambulatory Visit (INDEPENDENT_AMBULATORY_CARE_PROVIDER_SITE_OTHER): Payer: Self-pay | Admitting: Family Medicine

## 2015-08-28 ENCOUNTER — Encounter: Payer: Self-pay | Admitting: Family Medicine

## 2015-08-28 VITALS — BP 109/52 | HR 65 | Temp 98.6°F | Wt 205.0 lb

## 2015-08-28 DIAGNOSIS — R55 Syncope and collapse: Secondary | ICD-10-CM

## 2015-08-28 NOTE — Telephone Encounter (Signed)
Will forward to MD. Christina Vega,CMA  

## 2015-08-28 NOTE — Telephone Encounter (Signed)
The patients employer called because the letter that was written does not give any details to her work restrictions. 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 The letter must be specific and also stating for how long.   jw

## 2015-08-30 ENCOUNTER — Encounter (HOSPITAL_BASED_OUTPATIENT_CLINIC_OR_DEPARTMENT_OTHER): Payer: Self-pay | Admitting: Family Medicine

## 2015-08-30 NOTE — Telephone Encounter (Signed)
done

## 2015-08-30 NOTE — Telephone Encounter (Signed)
New letter drafted, can you please print this and fax (fax number is in the letter)

## 2015-08-31 NOTE — Progress Notes (Signed)
   Subjective:    Patient ID: Christina Vega, female    DOB: 08/18/1985, 30 y.o.   MRN: [id number]  HPI  CC: follow up work note  # Dizziness:  Had an episode of dizziness/syncope last week while at work  No recurrent symptoms since that time  Denies any pain  She says she hasn't been able to work because of a note that was given to her, says work needs more specific information ROS: denies heart palpitations, chest pain, shortness of breath, dizziness  Social Hx: never smoker  Review of Systems   See HPI for ROS.   Past medical history, surgical, family, and social history reviewed and updated in the EMR as appropriate. Objective:  BP 109/52 mmHg  Pulse 65  Temp(Src) 98.6 F (37 C) (Oral)  Wt 205 lb (92.987 kg)  LMP 08/06/2015 Vitals and nursing note reviewed  General: no apparent distress  CV: normal rate, regular rhythm  Resp: clear to auscultation bilaterally, normal effort Neuro: alert and oriented, normal gait, grossly normal.  Assessment & Plan:  Syncope Report today and last office visit this seems most consistent with vasovagal. No recurrent symptoms. She is okay to return to work without restrictions. Letter drafted (second letter drafted after received reply asking for more specifics regarding limitations). Follow up as needed     No Follow-up on file.

## 2015-08-31 NOTE — Assessment & Plan Note (Signed)
Report today and last office visit this seems most consistent with vasovagal. No recurrent symptoms. She is okay to return to work without restrictions. Letter drafted (second letter drafted after received reply asking for more specifics regarding limitations). Follow up as needed

## 2015-11-11 ENCOUNTER — Emergency Department (HOSPITAL_COMMUNITY)
Admission: EM | Admit: 2015-11-11 | Discharge: 2015-11-12 | Disposition: A | Discharge status: Home / Self Care | Payer: Self-pay | Attending: Emergency Medicine | Admitting: Emergency Medicine

## 2015-11-11 ENCOUNTER — Encounter (HOSPITAL_COMMUNITY): Payer: Self-pay | Admitting: *Deleted

## 2015-11-11 DIAGNOSIS — K219 Gastro-esophageal reflux disease without esophagitis: Secondary | ICD-10-CM | POA: Insufficient documentation

## 2015-11-11 DIAGNOSIS — Z79899 Other long term (current) drug therapy: Secondary | ICD-10-CM | POA: Insufficient documentation

## 2015-11-11 LAB — COMPREHENSIVE METABOLIC PANEL
ALBUMIN: 3.9 g/dL (ref 3.5–5.0)
ALT: 12 U/L — ABNORMAL LOW (ref 14–54)
ANION GAP: 3 — AB (ref 5–15)
AST: 15 U/L (ref 15–41)
Alkaline Phosphatase: 63 U/L (ref 38–126)
CALCIUM: 8.9 mg/dL (ref 8.9–10.3)
CHLORIDE: 110 mmol/L (ref 101–111)
CO2: 25 mmol/L (ref 22–32)
Creatinine, Ser: 0.48 mg/dL (ref 0.44–1.00)
GFR calc Af Amer: >60 mL/min (ref >60)
GFR calc non Af Amer: >60 mL/min (ref >60)
GLUCOSE: 94 mg/dL (ref 65–99)
POTASSIUM: 3.9 mmol/L (ref 3.5–5.1)
Sodium: 138 mmol/L (ref 135–145)
Total Bilirubin: 0.5 mg/dL (ref 0.3–1.2)
Total Protein: 6.7 g/dL (ref 6.5–8.1)

## 2015-11-11 LAB — URINALYSIS, ROUTINE W REFLEX MICROSCOPIC
Bilirubin Urine: NEGATIVE
GLUCOSE, UA: NEGATIVE mg/dL
KETONES UR: NEGATIVE mg/dL
LEUKOCYTES UA: NEGATIVE
NITRITE: NEGATIVE
PROTEIN: NEGATIVE mg/dL
Specific Gravity, Urine: 1.028 (ref 1.005–1.030)
pH: 7 (ref 5.0–8.0)

## 2015-11-11 LAB — CBC
HCT: 37.5 % (ref 36.0–46.0)
HEMOGLOBIN: 11.7 g/dL — AB (ref 12.0–15.0)
MCH: 27.5 pg (ref 26.0–34.0)
MCHC: 31.2 g/dL (ref 30.0–36.0)
MCV: 88 fL (ref 78.0–100.0)
Platelets: 393 10*3/uL (ref 150–400)
RBC: 4.26 MIL/uL (ref 3.87–5.11)
RDW: 12.7 % (ref 11.5–15.5)
WBC: 10.8 10*3/uL — ABNORMAL HIGH (ref 4.0–10.5)

## 2015-11-11 LAB — URINE MICROSCOPIC-ADD ON: WBC UA: NONE SEEN WBC/hpf (ref 0–5)

## 2015-11-11 LAB — POC URINE PREG, ED: Preg Test, Ur: NEGATIVE

## 2015-11-11 LAB — LIPASE, BLOOD: LIPASE: 25 U/L (ref 11–51)

## 2015-11-11 MED ORDER — SUCRALFATE 1 GM/10ML PO SUSP: 1.0000 g | Freq: Three times a day (TID) | ORAL | 0 refills | Status: DC

## 2015-11-11 MED ORDER — OMEPRAZOLE 20 MG PO CPDR: 20.0000 mg | DELAYED_RELEASE_CAPSULE | Freq: Every day | ORAL | 0 refills | Status: DC

## 2015-11-11 MED ORDER — GI COCKTAIL ~~LOC~~
30.0000 mL | Freq: Once | ORAL | Status: AC
  Administered 2015-11-11: 30 mL via ORAL
  Filled 2015-11-11: qty 30

## 2015-11-11 NOTE — ED Provider Notes (Signed)
[facility]-EMERGENCY DEPT Provider Note   CSN: [id number] Arrival date & time: 11/11/15  1937     History   Chief Complaint Chief Complaint  Patient presents with  . Abdominal Pain    HPI Christina Vega is a 30 y.o. female.  Patient presents to the emergency department with chief complaint of upper abdominal pain. She speaks fairly good [place]English, history is also obtained via the family member. Patient states that her symptoms are worsened after eating. He states that she has a better and bad taste in her mouth after eating. She does have some burning in her throat after eating. She reports having some nausea which is associated with the symptoms. She reports occasional right upper quadrant tenderness, but denies any pain now. Denies any fevers or chills. There are no other associated symptoms. There are no other modifying factors.   The history is provided by the patient. No language interpreter was used.    History reviewed. No pertinent past medical history.  Patient Active Problem List   Diagnosis Date Noted  . Syncope 08/24/2015  . Viral illness 06/09/2015  . Muscle spasm of back 02/21/2015  . Hypoesthesia of skin 07/29/2014  . Nightmares 07/05/2014  . Pain in surgical scar 07/04/2014  . Refugee health examination 07/04/2014  . Language barrier: Primary language - Fur. Needs interpreter 07/04/2014    Past Surgical History:  Procedure Laterality Date  . CESAREAN SECTION      OB History    No data available       Home Medications    Prior to Admission medications   Medication Sig Start Date End Date Taking? Authorizing Provider  acetaminophen (TYLENOL) 500 MG tablet Take 1 tablet (500 mg total) by mouth every 6 (six) hours as needed for mild pain or moderate pain. 07/04/14  Yes Nani RavensAndrew M Wight, MD    Family History No family history on file.  Social History Social History  Substance Use Topics  . Smoking status: Never Smoker  . Smokeless tobacco: Never Used  .  Alcohol use No     Allergies   Review of patient's allergies indicates no known allergies.   Review of Systems Review of Systems  Gastrointestinal: Positive for abdominal pain and nausea.  All other systems reviewed and are negative.    Physical Exam Updated Vital Signs BP 96/72 (BP Location: Right Arm)   Pulse 72   Temp 98.2 F (36.8 C) (Oral)   Resp 20   Ht 5\' 5"  (1.651 m)   Wt 91.2 kg   SpO2 99%   BMI 33.45 kg/m   Physical Exam  Constitutional: She is oriented to person, place, and time. She appears well-developed and well-nourished.  Well-appearing  HENT:  Head: Normocephalic and atraumatic.  Eyes: Conjunctivae and EOM are normal. Pupils are equal, round, and reactive to light.  Neck: Normal range of motion. Neck supple.  Cardiovascular: Normal rate and regular rhythm.  Exam reveals no gallop and no friction rub.   No murmur heard. Pulmonary/Chest: Effort normal and breath sounds normal. No respiratory distress. She has no wheezes. She has no rales. She exhibits no tenderness.  Abdominal: Soft. Bowel sounds are normal. She exhibits no distension and no mass. There is tenderness. There is no rebound and no guarding.  Mild epigastric abdominal tenderness, no other focal abdominal tenderness  Musculoskeletal: Normal range of motion. She exhibits no edema or tenderness.  Neurological: She is alert and oriented to person, place, and time.  Skin: Skin is warm  and dry.  Psychiatric: She has a normal mood and affect. Her behavior is normal. Judgment and thought content normal.  Nursing note and vitals reviewed.    ED Treatments / Results  Labs (all labs ordered are listed, but only abnormal results are displayed) Labs Reviewed  COMPREHENSIVE METABOLIC PANEL - Abnormal; Notable for the following:       Result Value   BUN <5 (*)    ALT 12 (*)    Anion gap 3 (*)    All other components within normal limits  CBC - Abnormal; Notable for the following:    WBC 10.8 (*)     Hemoglobin 11.7 (*)    All other components within normal limits  URINALYSIS, ROUTINE W REFLEX MICROSCOPIC (NOT [facility]) - Abnormal; Notable for the following:    Hgb urine dipstick TRACE (*)    All other components within normal limits  URINE MICROSCOPIC-ADD ON - Abnormal; Notable for the following:    Squamous Epithelial / LPF 0-5 (*)    Bacteria, UA RARE (*)    All other components within normal limits  LIPASE, BLOOD  POC URINE PREG, ED    EKG  EKG Interpretation None       Radiology No results found.  Procedures Procedures (including critical care time)  Medications Ordered in ED Medications  gi cocktail (Maalox,Lidocaine,Donnatal) (not administered)     Initial Impression / Assessment and Plan / ED Course  I have reviewed the triage vital signs and the nursing notes.  Pertinent labs & imaging results that were available during my care of the patient were reviewed by me and considered in my medical decision making (see chart for details).  Clinical Course    Patient with upper abdominal pain 1 week. Her symptoms are worsened after eating. She reports a better and foul taste/burning sensation after eating. Her symptoms sound very consistent with GERD. I will give her a GI cocktail. I doubt pancreatitis, lipase is normal today. I doubt gallbladder disease, she has no right upper quadrant tenderness, nor any elevated LFTs. She has no focal abdominal tenderness.  Final Clinical Impressions(s) / ED Diagnoses   Final diagnoses:  Gastroesophageal reflux disease, esophagitis presence not specified    New Prescriptions New Prescriptions   OMEPRAZOLE (PRILOSEC) 20 MG CAPSULE    Take 1 capsule (20 mg total) by mouth daily.   SUCRALFATE (CARAFATE) 1 GM/10ML SUSPENSION    Take 10 mLs (1 g total) by mouth 4 (four) times daily -  with meals and at bedtime.     Roxy Horsemanobert Maigen Mozingo, PA-C 11/11/15 2321    Melene Planan Floyd, DO 11/11/15 [phone]

## 2015-11-11 NOTE — ED Triage Notes (Signed)
The pt speaks arabic  She is c/o abd pain for approx one week  With nausea and a bad taste in her mouth.  lmp b c

## 2015-11-12 NOTE — ED Notes (Signed)
Pt d/c home  

## 2015-12-26 DIAGNOSIS — G4730 Sleep apnea, unspecified: Secondary | ICD-10-CM

## 2015-12-27 NOTE — Congregational Nurse Program (Signed)
Congregational Nurse Program Note  Date of Encounter: 12/26/2015  Past Medical History: No past medical history on file.  Encounter Details:     CNP Questionnaire - 12/27/15 1420      Patient Demographics   Is this a new or existing patient? Existing   Patient is considered a/an Refugee   Race African     Patient Assistance   Location of Patient Assistance Not Applicable   Patient's financial/insurance status Private Insurance Coverage   Uninsured Patient No   Patient referred to apply for the following financial assistance Not Applicable   Food insecurities addressed Not Applicable   Transportation assistance No   Assistance securing medications No   Educational health offerings Exercise/physical activity;Other     Encounter Details   Primary purpose of visit Other   Was an Emergency Department visit averted? Not Applicable   Does patient have a medical provider? Yes   Patient referred to Follow up with established PCP   Was a mental health screening completed? (GAINS tool) No   Does patient have dental issues? No   Does patient have vision issues? No   Does your patient have an abnormal blood pressure today? Yes   Since previous encounter, have you referred patient for abnormal blood pressure that resulted in a new diagnosis or medication change? No   Does your patient have an abnormal blood glucose today? No   Since previous encounter, have you referred patient for abnormal blood glucose that resulted in a new diagnosis or medication change? No   Was there a life-saving intervention made? No     Brief office contact for this [place] and Arabic speaking lady to request advice regarding "sleeping problem." Able to converse in [place] and describe condition. Describes problem as unable to sleep after employment working nights six months. Lives with spouse and four children. Engaged in school activities with children daily and feels very active and still unable to sleep.  Complains of headache today also. Hx of anemia. Last physical exam by PCP 8/17. Currently on no medications. Plan: Refer to  OTC med, Melatonin per physician. Increase water intake; no caffeine. Consult pharmacy for more instructions. Consult with Claris Gowerharlotte Evans/[facility] at [facility] for stress reducing techniques; outdoor relaxed walking with a friend 30-45 minutes daily; use organized and  routine schedule for family. Return in 2 weeks for B/P check and referral to PCP for evaluation. Christina LuzMarietta Belicia Difatta, RN/CN

## 2016-01-29 NOTE — Congregational Nurse Program (Signed)
Congregational Nurse Program Note  Date of Encounter: 12/28/2015  C/o of being "very hot".  The building is warm and client has multiple layers of clothing.  Face flushed.  B/P  107/65  Pulse 87.  Gave client water and had her sit several minutes in front of an air conditioning unit.  Felt better after several minutes and returned to class  Past Medical History: No past medical history on file.  Encounter Details:

## 2016-03-01 NOTE — Congregational Nurse Program (Signed)
Congregational Nurse Program Note  Date of Encounter: 02/21/2016  Past Medical History: No past medical history on file.  Encounter Details:     CNP Questionnaire - 02/21/16 1200      Patient Demographics   Is this a new or existing patient? Existing   Patient is considered a/an Refugee   Race African     Patient Assistance   Location of Patient Assistance Not Applicable   Patient's financial/insurance status Self-Pay (Uninsured)   Uninsured Patient ([employer] officer, trade unionCard/Care Connects) Yes   Interventions Referred to ED/Urgent Care   Patient referred to apply for the following financial assistance [employer]   Food insecurities addressed Not Technical brewerApplicable   Transportation assistance No   Assistance securing medications No   Product/process development scientistducational health offerings Navigating the healthcare system;Other     Encounter Details   Primary purpose of visit Acute Illness/Condition Visit   Was an Emergency Department visit averted? Not Applicable   Does patient have a medical provider? No   Patient referred to Urgent Care   Was a mental health screening completed? (GAINS tool) No   Does patient have dental issues? No   Does patient have vision issues? No   Does your patient have an abnormal blood pressure today? No   Since previous encounter, have you referred patient for abnormal blood pressure that resulted in a new diagnosis or medication change? No   Does your patient have an abnormal blood glucose today? No   Since previous encounter, have you referred patient for abnormal blood glucose that resulted in a new diagnosis or medication change? No   Was there a life-saving intervention made? No     Returning office visit for [name] with a complaint of headache, weakness, dizziness, neck pain and bilateral ear  pain. States " my heart feels like its beating too fast". Reports history of asthma. Questionable slight wheezing and rales in left chest. Afebrile. 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 No insurance coverage. Cautioned in use of caustic products and inhalation in cleaning products. Use proper ventilation when cleaning.  [facility]. [facility] or Health Care coverage with agency staff. Return for re-evaluation and follow-up prn. Christina LuzMarietta Arlen Legendre, RN/CN

## 2016-12-06 ENCOUNTER — Emergency Department (HOSPITAL_COMMUNITY)
Admission: EM | Admit: 2016-12-06 | Discharge: 2016-12-06 | Disposition: A | Discharge status: Home / Self Care | Payer: Medicaid Other | Attending: Emergency Medicine | Admitting: Emergency Medicine

## 2016-12-06 ENCOUNTER — Emergency Department (HOSPITAL_COMMUNITY): Payer: Medicaid Other

## 2016-12-06 ENCOUNTER — Encounter (HOSPITAL_COMMUNITY): Payer: Self-pay | Admitting: Emergency Medicine

## 2016-12-06 DIAGNOSIS — Z79899 Other long term (current) drug therapy: Secondary | ICD-10-CM | POA: Insufficient documentation

## 2016-12-06 DIAGNOSIS — R109 Unspecified abdominal pain: Secondary | ICD-10-CM

## 2016-12-06 DIAGNOSIS — K5900 Constipation, unspecified: Secondary | ICD-10-CM | POA: Insufficient documentation

## 2016-12-06 LAB — CBC
HCT: 38.8 % (ref 36.0–46.0)
Hemoglobin: 12.5 g/dL (ref 12.0–15.0)
MCH: 27.5 pg (ref 26.0–34.0)
MCHC: 32.2 g/dL (ref 30.0–36.0)
MCV: 85.5 fL (ref 78.0–100.0)
PLATELETS: 412 10*3/uL — AB (ref 150–400)
RBC: 4.54 MIL/uL (ref 3.87–5.11)
RDW: 12.9 % (ref 11.5–15.5)
WBC: 10.5 10*3/uL (ref 4.0–10.5)

## 2016-12-06 LAB — URINALYSIS, ROUTINE W REFLEX MICROSCOPIC
Bilirubin Urine: NEGATIVE
GLUCOSE, UA: NEGATIVE mg/dL
KETONES UR: NEGATIVE mg/dL
Leukocytes, UA: NEGATIVE
NITRITE: NEGATIVE
PH: 6 (ref 5.0–8.0)
Protein, ur: NEGATIVE mg/dL
Specific Gravity, Urine: 1.02 (ref 1.005–1.030)

## 2016-12-06 LAB — COMPREHENSIVE METABOLIC PANEL
ALK PHOS: 77 U/L (ref 38–126)
ALT: 12 U/L — AB (ref 14–54)
AST: 15 U/L (ref 15–41)
Albumin: 3.6 g/dL (ref 3.5–5.0)
Anion gap: 3 — ABNORMAL LOW (ref 5–15)
BILIRUBIN TOTAL: 0.6 mg/dL (ref 0.3–1.2)
BUN: 5 mg/dL — ABNORMAL LOW (ref 6–20)
CALCIUM: 9.2 mg/dL (ref 8.9–10.3)
CO2: 30 mmol/L (ref 22–32)
CREATININE: 0.51 mg/dL (ref 0.44–1.00)
Chloride: 103 mmol/L (ref 101–111)
Glucose, Bld: 110 mg/dL — ABNORMAL HIGH (ref 65–99)
Potassium: 3.3 mmol/L — ABNORMAL LOW (ref 3.5–5.1)
Sodium: 136 mmol/L (ref 135–145)
TOTAL PROTEIN: 7.2 g/dL (ref 6.5–8.1)

## 2016-12-06 LAB — I-STAT BETA HCG BLOOD, ED (MC, WL, AP ONLY): I-stat hCG, quantitative: <5 m[IU]/mL (ref <5)

## 2016-12-06 LAB — LIPASE, BLOOD: Lipase: 25 U/L (ref 11–51)

## 2016-12-06 MED ORDER — MORPHINE SULFATE (PF) 4 MG/ML IV SOLN
4.0000 mg | INTRAVENOUS | Status: AC
  Administered 2016-12-06: 4 mg via INTRAVENOUS

## 2016-12-06 MED ORDER — ONDANSETRON HCL 4 MG PO TABS: 4.0000 mg | ORAL_TABLET | Freq: Three times a day (TID) | ORAL | 0 refills | Status: DC | PRN

## 2016-12-06 MED ORDER — POLYETHYLENE GLYCOL 3350 17 G PO PACK: 17.0000 g | PACK | Freq: Every day | ORAL | 0 refills | Status: DC

## 2016-12-06 MED ORDER — MORPHINE SULFATE (PF) 4 MG/ML IV SOLN
4.0000 mg | Freq: Once | INTRAVENOUS | Status: DC
  Filled 2016-12-06: qty 1

## 2016-12-06 NOTE — ED Provider Notes (Signed)
[facility]-EMERGENCY DEPT Provider Note   CSN: [id number] Arrival date & time: 12/06/16  1345     History   Chief Complaint Chief Complaint  Patient presents with  . Abdominal Pain  . Flank Pain    HPI Christina Vega is a 31 y.o. female.  The history is provided by the patient and the spouse.  Flank Pain  This is a new problem. The current episode started 2 days ago. The problem occurs constantly. The problem has not changed since onset.Associated symptoms include abdominal pain. Pertinent negatives include no chest pain, no headaches and no shortness of breath. Nothing aggravates the symptoms. Nothing relieves the symptoms. She has tried nothing for the symptoms.    History reviewed. No pertinent past medical history.  Patient Active Problem List   Diagnosis Date Noted  . Syncope 08/24/2015  . Viral illness 06/09/2015  . Muscle spasm of back 02/21/2015  . Hypoesthesia of skin 07/29/2014  . Nightmares 07/05/2014  . Pain in surgical scar 07/04/2014  . Refugee health examination 07/04/2014  . Language barrier: Primary language - Fur. Needs interpreter 07/04/2014    Past Surgical History:  Procedure Laterality Date  . CESAREAN SECTION      OB History    No data available       Home Medications    Prior to Admission medications   Medication Sig Start Date End Date Taking? Authorizing Provider  acetaminophen (TYLENOL) 500 MG tablet Take 1 tablet (500 mg total) by mouth every 6 (six) hours as needed for mild pain or moderate pain. 07/04/14   Nani Ravens, MD  omeprazole (PRILOSEC) 20 MG capsule Take 1 capsule (20 mg total) by mouth daily. 11/11/15   Roxy Horseman, PA-C  sucralfate (CARAFATE) 1 GM/10ML suspension Take 10 mLs (1 g total) by mouth 4 (four) times daily -  with meals and at bedtime. 11/11/15   Roxy Horseman, PA-C    Family History No family history on file.  Social History Social History  Substance Use Topics  . Smoking status: Never Smoker  .  Smokeless tobacco: Never Used  . Alcohol use No     Allergies   Patient has no known allergies.   Review of Systems Review of Systems  Constitutional: Negative for diaphoresis, fatigue and fever.  HENT: Negative for congestion and rhinorrhea.   Respiratory: Negative for cough, chest tightness, shortness of breath and wheezing.   Cardiovascular: Negative for chest pain.  Gastrointestinal: Positive for abdominal pain. Negative for abdominal distention, constipation, diarrhea, nausea and vomiting.  Genitourinary: Positive for flank pain and vaginal bleeding (on menstrual cucle). Negative for difficulty urinating, dysuria, frequency, menstrual problem, pelvic pain, vaginal discharge and vaginal pain.  Musculoskeletal: Negative for back pain, neck pain and neck stiffness.  Skin: Negative for rash and wound.  Neurological: Negative for headaches.  Psychiatric/Behavioral: Negative for agitation.  All other systems reviewed and are negative.    Physical Exam Updated Vital Signs BP 123/86   Pulse 87   Temp 98 F (36.7 C) (Oral)   Resp 16   LMP 12/06/2016   SpO2 100%   Physical Exam  Constitutional: She appears well-developed and well-nourished. No distress.  HENT:  Head: Normocephalic.  Mouth/Throat: Oropharynx is clear and moist. No oropharyngeal exudate.  Eyes: Pupils are equal, round, and reactive to light. Conjunctivae and EOM are normal.  Neck: Normal range of motion.  Cardiovascular: Intact distal pulses.   No murmur heard. Pulmonary/Chest: Effort normal. No stridor. No respiratory distress. She has  no wheezes. She exhibits no tenderness.  Abdominal: Soft. Normal appearance. She exhibits no distension. There is tenderness in the right upper quadrant and right lower quadrant. There is CVA tenderness. There is no rigidity and no rebound.    Musculoskeletal: She exhibits tenderness.       Back:  Neurological: She is alert. No sensory deficit. She exhibits normal muscle  tone.  Skin: Capillary refill takes less than 2 seconds. No rash noted. She is not diaphoretic. No erythema.  Psychiatric: She has a normal mood and affect.  Nursing note and vitals reviewed.    ED Treatments / Results  Labs (all labs ordered are listed, but only abnormal results are displayed) Labs Reviewed  COMPREHENSIVE METABOLIC PANEL - Abnormal; Notable for the following:       Result Value   Potassium 3.3 (*)    Glucose, Bld 110 (*)    BUN 5 (*)    ALT 12 (*)    Anion gap 3 (*)    All other components within normal limits  CBC - Abnormal; Notable for the following:    Platelets 412 (*)    All other components within normal limits  URINALYSIS, ROUTINE W REFLEX MICROSCOPIC - Abnormal; Notable for the following:    APPearance HAZY (*)    Hgb urine dipstick MODERATE (*)    Bacteria, UA RARE (*)    Squamous Epithelial / LPF 0-5 (*)    All other components within normal limits  LIPASE, BLOOD  I-STAT BETA HCG BLOOD, ED ([facility], [facility], [facility] ONLY)    EKG  EKG Interpretation None       Radiology Ct Renal Stone Study  Result Date: 12/06/2016 CLINICAL DATA:  Right flank pain x2 days EXAM: CT ABDOMEN AND PELVIS WITHOUT CONTRAST TECHNIQUE: Multidetector CT imaging of the abdomen and pelvis was performed following the standard protocol without IV contrast. COMPARISON:  03/15/2014 FINDINGS: Lower chest: No acute abnormality. Hepatobiliary: No focal liver abnormality is seen given limitations of a noncontrast study. No gallstones, gallbladder wall thickening, or biliary dilatation. Pancreas: Unremarkable. No pancreatic ductal dilatation or surrounding inflammatory changes. Spleen: Normal in size without focal abnormality. Adrenals/Urinary Tract: Normal bilateral adrenal glands and kidneys. No nephrolithiasis nor hydronephrosis. No ureteral calcifications. The bladder is nondistended and is without focal mural thickening or calculi. Stomach/Bowel: Moderate colonic stool burden along the right  colon from cecum through hepatic flexure. Normal appendix. No bowel obstruction or inflammation. Nondistended stomach is noted. Vascular/Lymphatic: No significant vascular findings are present. No enlarged abdominal or pelvic lymph nodes. Reproductive: Uterus and bilateral adnexa are unremarkable. Other: No abdominal wall hernia or abnormality. No abdominopelvic ascites. Musculoskeletal: No acute or significant osseous findings. IMPRESSION: 1. No obstructive uropathy or nephrolithiasis. 2. Moderate fecal retention within the right colon without bowel obstruction or inflammation. Electronically Signed   By: Tollie Eth M.D.   On: 12/06/2016 18:21    Procedures Procedures (including critical care time)  Medications Ordered in ED Medications  morphine 4 MG/ML injection 4 mg (4 mg Intravenous Given 12/06/16 1848)     Initial Impression / Assessment and Plan / ED Course  I have reviewed the triage vital signs and the nursing notes.  Pertinent labs & imaging results that were available during my care of the patient were reviewed by me and considered in my medical decision making (see chart for details).     Kalimah Capurro is a 31 y.o. female with no significant past medical history who presents with several days of  right-sided back and flank pain.Patient says that for the last 2 days she has had intermittent sharp pain in her right back and right flank radiating into the side of her abdomen. She denies any groin pain. She reports that she is currently on her menstrual cycle. She denies any dysuria orchange in urine amount. She denies any constipation or diarrhea. She denies any traumatic injuries. She denies nausea, vomiting, constipation, or diarrhea. She describes her pain as sharp and a 10 out of 10 in severity. She reports this is a new pain she's never had before. She denies any history of kidney stones to her knowledge. She denies any other complaints including no fevers, chills, chest pain,  shortness breath, or cough. She reports normal vaginal bleeding and denies any vaginal discharge. She denies any pelvic pain.  On exam, patient has right-sided CVA tenderness and right flank tenderness. Abdomen is nontender. No lower abdominal tenderness. Patient's lungs are clear. No chest tenderness. No lower extremity swelling or tenderness.  Based on symptoms, patient had laboratory testing and pain medicines were provided. Laboratory testing seen above. Patient's urinalysis did not show evidence of UTI however there was hemoglobin. This is either secondary to kidney stone or her menstrual cycle she is currently on. In the setting of the CVA tenderness and flank pain, patient will have imaging to look for kidney stone. Pertinent to test negative. Metabolic panel showed mild hypokalemia but was otherwise reassuring. Patient's lipase was nonelevated and CBC was reassuring with no leukocytosis or anemia.  CT scan was ordered to rule out kidney stone.  CT scan showed no evidence of kidney stone or uropathy. There was a large amount of stool in the right colon and cecum likely causing the symptoms.   Patient reassessed and did not want to pursue pelvic exam or further pelvic evaluation of cause of symptoms. Patient's CT scan did not show abnormality with the adnexa or uterus.  Patient was reassessed and her symptoms improved with pain medication. Suspect constipation as cause of symptoms. Patient will be given pressure for MiraLAX and nausea medicine to ensure hydration. Patient will follow up with her PCP and understood strict return precautions. Patient had no other questions or concerns and was discharged in good condition with understanding of plan of care.    Final Clinical Impressions(s) / ED Diagnoses   Final diagnoses:  Right lateral abdominal pain  Right flank pain  Constipation, unspecified constipation type    New Prescriptions Discharge Medication List as of 12/06/2016  9:17 PM      START taking these medications   Details  ondansetron (ZOFRAN) 4 MG tablet Take 1 tablet (4 mg total) by mouth every 8 (eight) hours as needed for nausea or vomiting., Starting Fri 12/06/2016, Print    polyethylene glycol (MIRALAX) packet Take 17 g by mouth daily., Starting Fri 12/06/2016, Print        Clinical Impression: 1. Right lateral abdominal pain   2. Right flank pain   3. Constipation, unspecified constipation type     Disposition: Discharge  Condition: Good  I have discussed the results, Dx and Tx plan with the pt(& family if present). He/she/they expressed understanding and agree(s) with the plan. Discharge instructions discussed at great length. Strict return precautions discussed and pt &/or family have verbalized understanding of the instructions. No further questions at time of discharge.    Discharge Medication List as of 12/06/2016  9:17 PM    START taking these medications   Details  ondansetron (  ZOFRAN) 4 MG tablet Take 1 tablet (4 mg total) by mouth every 8 (eight) hours as needed for nausea or vomiting., Starting Fri 12/06/2016, Print    polyethylene glycol (MIRALAX) packet Take 17 g by mouth daily., Starting Fri 12/06/2016, Print        Follow Up: [facility] [address] [place] [address] [phone] Schedule an appointment as soon as possible for a visit    [facility] EMERGENCY DEPARTMENT [address] [id number] [place] [address] [phone]  If symptoms worsen     Myers Tutterow, Canary Brim, MD 12/07/16 [id number]

## 2016-12-06 NOTE — ED Triage Notes (Addendum)
Pt states right sided flank pain and lower left/righ quadrant abdominal pain for 2 days. Denies nausea, vomiting, diarrhea, chest pain. LMP 3 days ago. Denies hx of kidney stones or urinary symptoms.  Pt states medical hx of back pain and neuropathy, denies other medical history.

## 2016-12-06 NOTE — Discharge Instructions (Signed)
Please take the MiraLAX to help with her constipation. We suspect that the constipation and stool seen on CT was the cause of your symptoms. You May use the nausea medicine to help stay hydrated. Please follow-up with your primary care physician for further constipation management and abdominal pain. If any symptoms change or worsen, please return to the nearest emergency department.

## 2016-12-06 NOTE — ED Notes (Signed)
Pt awaiting dispo from md. States that she is ready to go home. Family at bedside.

## 2016-12-23 ENCOUNTER — Ambulatory Visit: Payer: Medicaid Other | Admitting: Family Medicine

## 2018-07-12 IMAGING — CT CT RENAL STONE PROTOCOL
2 of 4 series · 17 of 46 positions shown, 19 images · non-contrast
Comparison: 03/15/2014

CLINICAL DATA: Right flank pain x2 days

EXAM:
CT ABDOMEN AND PELVIS WITHOUT CONTRAST
TECHNIQUE: Multidetector CT imaging of the abdomen and pelvis was performed
following the standard protocol without IV contrast.

[Series 3: renal stone 5.0 · axial · 0.96mm/px · z∈[+783,+1198]mm · 14 of 91 slices shown, 16 images]
[im 4/91  soft-tissue]
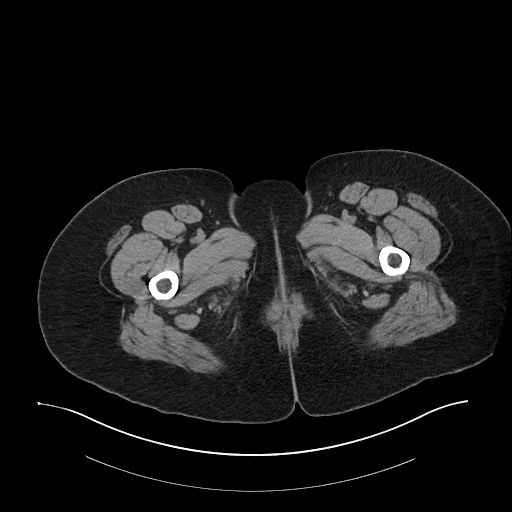
[im 4/91  bone]
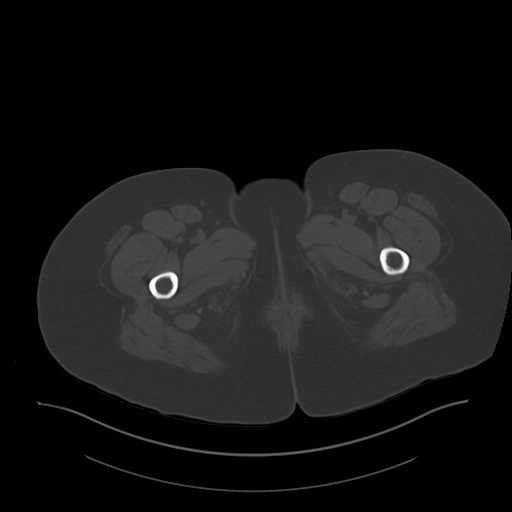
[im 11/91  soft-tissue]
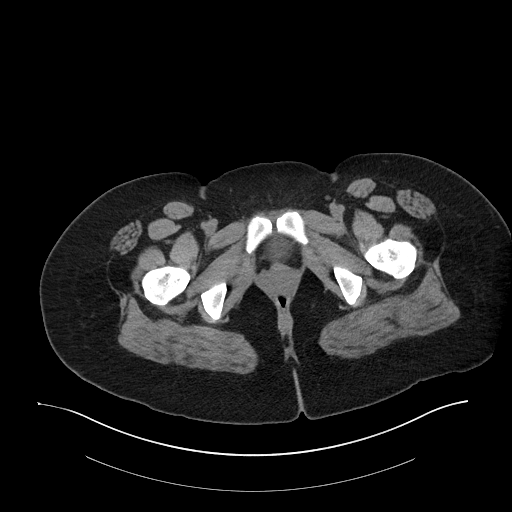
[im 18/91  soft-tissue]
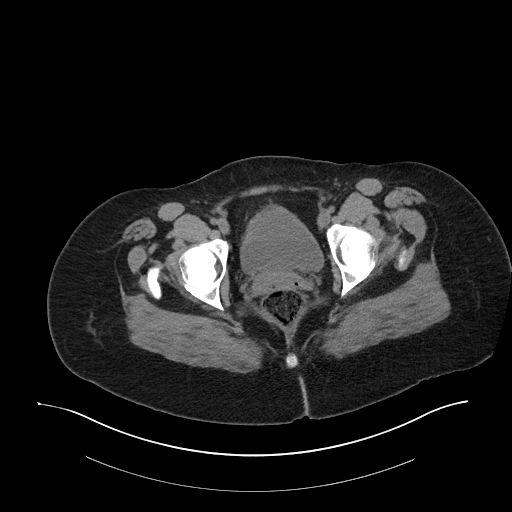
[im 25/91  soft-tissue]
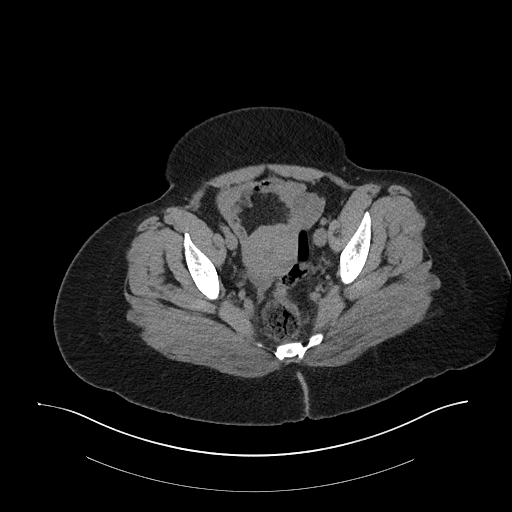
[im 32/91  soft-tissue]
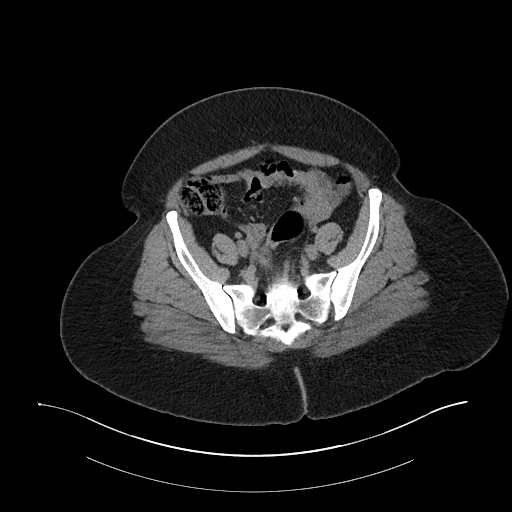
[im 35/91  soft-tissue]
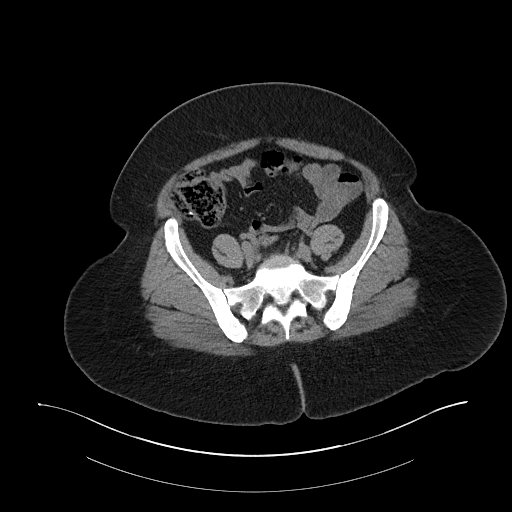
[im 42/91  soft-tissue]
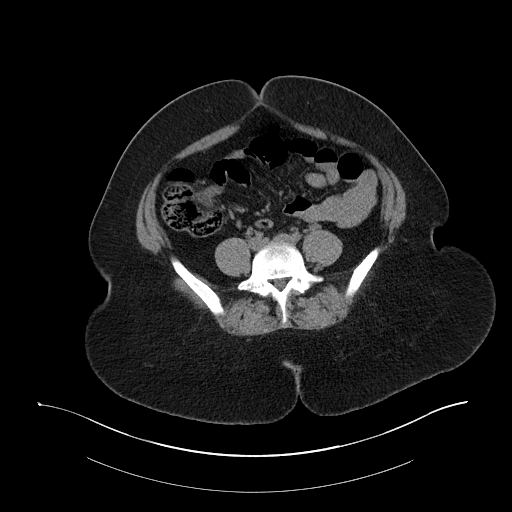
[im 49/91  soft-tissue]
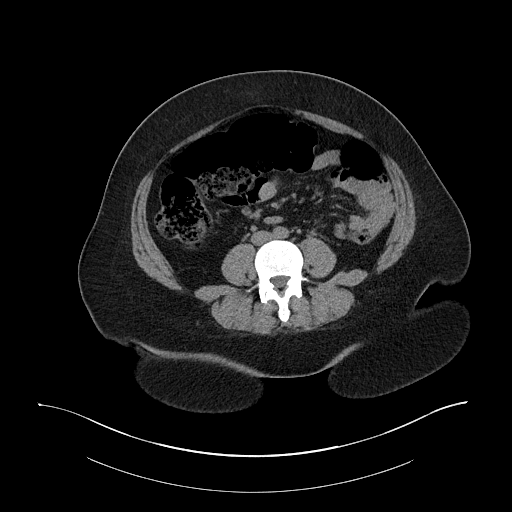
[im 56/91  soft-tissue]
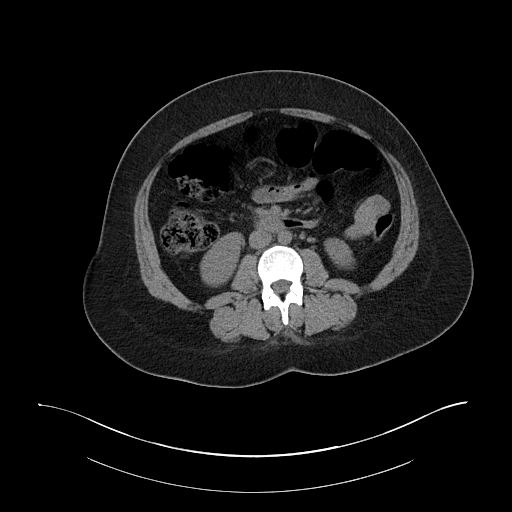
[im 56/91  bone]
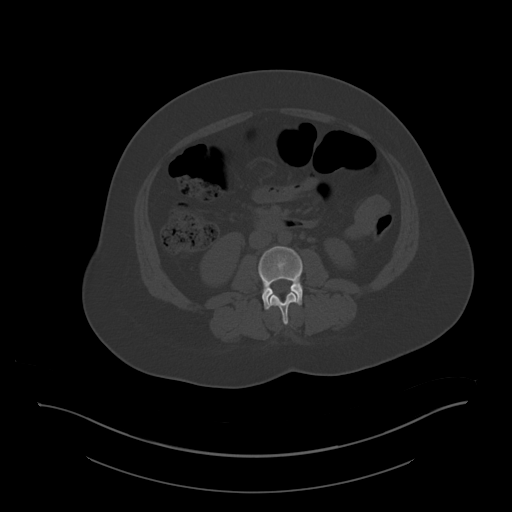
[im 59/91  soft-tissue]
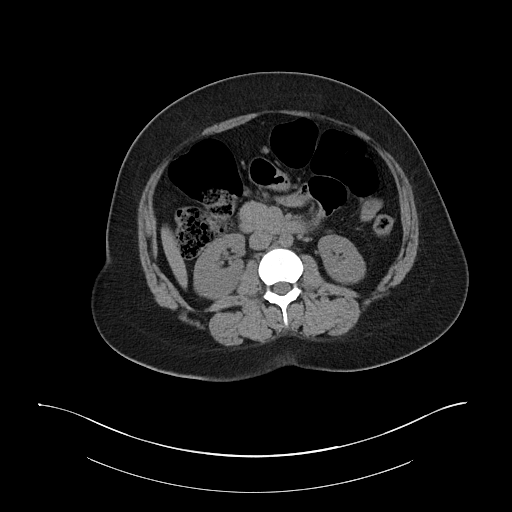
[im 66/91  soft-tissue]
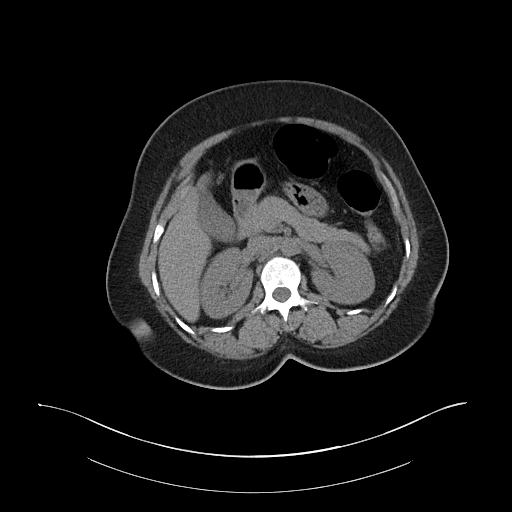
[im 73/91  soft-tissue]
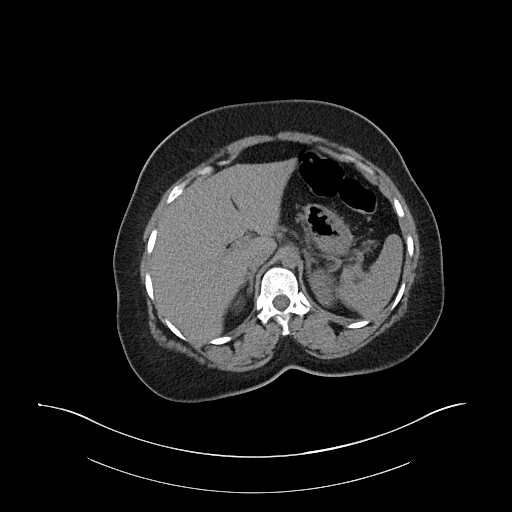
[im 80/91  soft-tissue]
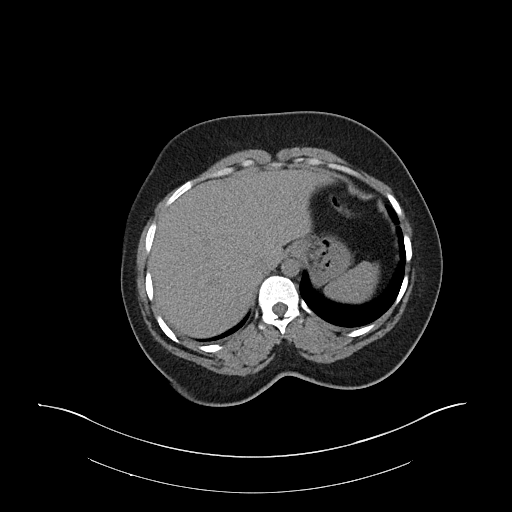
[im 87/91  soft-tissue]
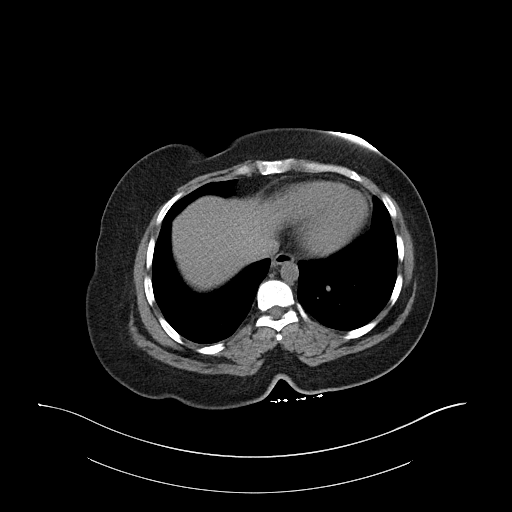

[Series 5: renal stone 3.0 cor · coronal · 0.89mm/px · 3 of 119 slices shown]
[im 40/119  soft-tissue]
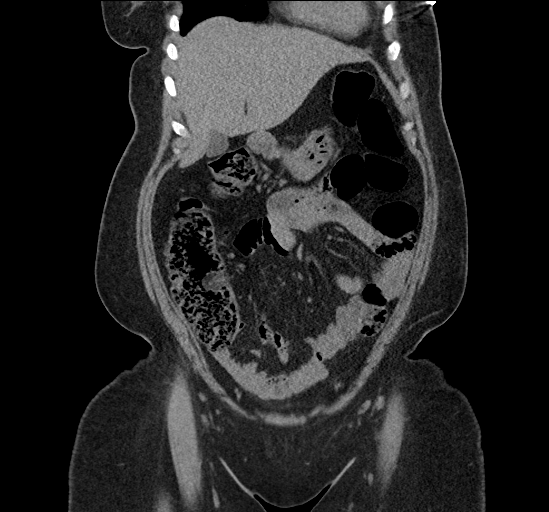
[im 53/119  soft-tissue]
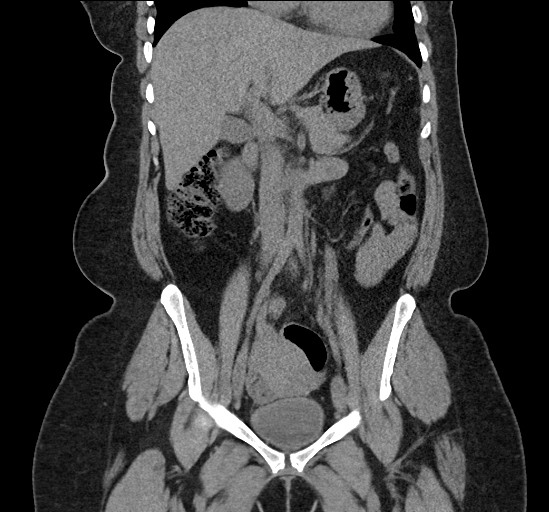
[im 66/119  soft-tissue]
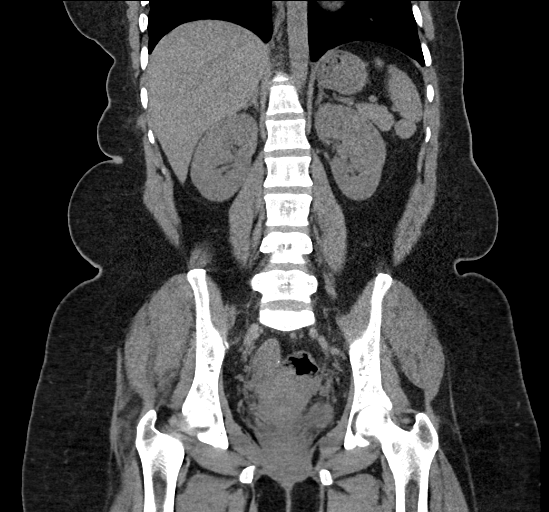

[17 of 46 positions shown; findings below may reference images not displayed]

FINDINGS: Lower chest: No acute abnormality.

Hepatobiliary: No focal liver abnormality is seen given limitations
of a noncontrast study. No gallstones, gallbladder wall thickening,
or biliary dilatation.

Pancreas: Unremarkable. No pancreatic ductal dilatation or
surrounding inflammatory changes.

Spleen: Normal in size without focal abnormality.

Adrenals/Urinary Tract: Normal bilateral adrenal glands and kidneys.
No nephrolithiasis nor hydronephrosis. No ureteral calcifications.
The bladder is nondistended and is without focal mural thickening or
calculi.

Stomach/Bowel: Moderate colonic stool burden along the right colon
from cecum through hepatic flexure. Normal appendix. No bowel
obstruction or inflammation. Nondistended stomach is noted.

Vascular/Lymphatic: No significant vascular findings are present. No
enlarged abdominal or pelvic lymph nodes.

Reproductive: Uterus and bilateral adnexa are unremarkable.

Other: No abdominal wall hernia or abnormality. No abdominopelvic
ascites.

Musculoskeletal: No acute or significant osseous findings.
IMPRESSION: 1. No obstructive uropathy or nephrolithiasis.
2. Moderate fecal retention within the right colon without bowel
obstruction or inflammation.

## 2018-10-02 ENCOUNTER — Other Ambulatory Visit: Payer: Self-pay

## 2018-10-02 ENCOUNTER — Ambulatory Visit: Payer: BLUE CROSS/BLUE SHIELD | Admitting: Family

## 2018-10-02 ENCOUNTER — Encounter: Payer: Self-pay | Admitting: Family

## 2018-10-02 ENCOUNTER — Encounter: Payer: Self-pay | Admitting: General Practice

## 2018-10-02 VITALS — BP 108/74 | HR 78 | Temp 98.4°F | Ht 65.0 in | Wt 185.4 lb

## 2018-10-02 DIAGNOSIS — Z3046 Encounter for surveillance of implantable subdermal contraceptive: Secondary | ICD-10-CM | POA: Diagnosis not present

## 2018-10-02 DIAGNOSIS — Z30017 Encounter for initial prescription of implantable subdermal contraceptive: Secondary | ICD-10-CM | POA: Diagnosis not present

## 2018-10-02 DIAGNOSIS — Z3202 Encounter for pregnancy test, result negative: Secondary | ICD-10-CM | POA: Diagnosis not present

## 2018-10-02 LAB — POCT URINE PREGNANCY: Preg Test, Ur: NEGATIVE

## 2018-10-02 MED ORDER — ETONOGESTREL 68 MG ~~LOC~~ IMPL
68.0000 mg | DRUG_IMPLANT | Freq: Once | SUBCUTANEOUS | Status: AC
  Administered 2018-10-02: 68 mg via SUBCUTANEOUS

## 2018-10-02 NOTE — Progress Notes (Signed)
Christina Vega 33 y.o. Vitals:   10/02/18 0942  BP: 108/74  Pulse: 78  Temp: 98.4 F (36.9 C)    History reviewed. No pertinent past medical history.  History reviewed. No pertinent family history.  Social History   Socioeconomic History  . Marital status: Married    Spouse name: Not on file  . Number of children: Not on file  . Years of education: Not on file  . Highest education level: Not on file  Occupational History  . Not on file  Social Needs  . Financial resource strain: Not on file  . Food insecurity    Worry: Not on file    Inability: Not on file  . Transportation needs    Medical: Not on file    Non-medical: Not on file  Tobacco Use  . Smoking status: Never Smoker  . Smokeless tobacco: Never Used  Substance and Sexual Activity  . Alcohol use: No  . Drug use: No  . Sexual activity: Yes    Partners: Male    Birth control/protection: Implant  Lifestyle  . Physical activity    Days per week: Not on file    Minutes per session: Not on file  . Stress: Not on file  Relationships  . Social Herbalist on phone: Not on file    Gets together: Not on file    Attends religious service: Not on file    Active member of club or organization: Not on file    Attends meetings of clubs or organizations: Not on file    Relationship status: Not on file  . Intimate partner violence    Fear of current or ex partner: Not on file    Emotionally abused: Not on file    Physically abused: Not on file    Forced sexual activity: Not on file  Other Topics Concern  . Not on file  Social History Narrative  . Not on file    HPI:  Christina Vega is here for Nexplanon removal and reinsertion.  She was given informed consent for removal of her Nexplanon and reinsertion of a new one.  A signed copy is in the chart.  Appropriate time out taken. Nexlanon site (left arm) identified and thea area was prepped in usual sterile fashon. 2 cc of 1% lidocaine was used to anesthetize  the area starting with the distal end of the implant. A small stab incision was made right beside the implant on the distal portion.  The Nexplanon rod was grasped using hemostats and removed;  A second Nexplanon was palpated in the same arm proximal to the 1st device.  The second Nexplanon was grasped using hemostats and removed without difficulty.  There was less than 3 cc blood loss. There were no complications. Next, the area was cleansed again on the right arm and the new Nexplanon was inserted without difficulty.  Bandaid and a pressure bandage were applied.    Pt was instructed to remove pressure bandage in a few hours, and keep insertion site covered with a bandaid for 3 days.  Plans to schedule well-woman exam.  Cyndee Brightly, CNM 10/02/2018 11:47 AM

## 2018-10-02 NOTE — Progress Notes (Signed)
Pt presents for nexplanon removal and re insertion. Pt states that current nexplanon has been in place for almost 5 years. She states that it was placed in [place].

## 2018-10-09 ENCOUNTER — Ambulatory Visit: Payer: BLUE CROSS/BLUE SHIELD | Admitting: Family

## 2018-10-09 ENCOUNTER — Other Ambulatory Visit: Payer: Self-pay

## 2018-10-09 ENCOUNTER — Encounter: Payer: Self-pay | Admitting: Family

## 2018-10-09 VITALS — BP 109/75 | HR 85 | Temp 98.5°F | Ht 65.0 in | Wt 187.0 lb

## 2018-10-09 DIAGNOSIS — Z1151 Encounter for screening for human papillomavirus (HPV): Secondary | ICD-10-CM | POA: Diagnosis not present

## 2018-10-09 DIAGNOSIS — Z01419 Encounter for gynecological examination (general) (routine) without abnormal findings: Secondary | ICD-10-CM

## 2018-10-09 DIAGNOSIS — Z124 Encounter for screening for malignant neoplasm of cervix: Secondary | ICD-10-CM

## 2018-10-09 DIAGNOSIS — Z113 Encounter for screening for infections with a predominantly sexual mode of transmission: Secondary | ICD-10-CM

## 2018-10-09 NOTE — Progress Notes (Signed)
History:  Ms. Christina Vega is a 33 y.o. G5P0 who presents to clinic today for well woman exam.  Family planning method:  Nexplanon.  Desires STI screening including HIV, RPR.  Denies any problems with Nexplanon insertion site from previous week.     The following portions of the patient's history were reviewed and updated as appropriate: allergies, current medications, family history, past medical history, social history, past surgical history and problem list.  Review of Systems:  Review of Systems  Constitutional: Negative for fatigue and fever.  HENT: Negative.   Respiratory: Negative for cough and shortness of breath.   Cardiovascular: Negative.   Gastrointestinal: Negative for diarrhea, nausea and vomiting.  Genitourinary: Negative for dysuria, pelvic pain and vaginal bleeding.  Neurological: Negative for dizziness, light-headedness and headaches.       Objective:  Physical Exam BP 109/75 (BP Location: Right Arm, Patient Position: Sitting, Cuff Size: Normal)   Pulse 85   Temp 98.5 F (36.9 C) (Oral)   Ht 5\' 5"  (1.651 m)   Wt 187 lb (84.8 kg)   BMI 31.12 kg/m  Physical Exam Exam conducted with a chaperone present.  Constitutional:      General: She is not in acute distress.    Appearance: She is well-developed.  HENT:     Head: Normocephalic and atraumatic.  Neck:     Musculoskeletal: Normal range of motion and neck supple.     Thyroid: No thyromegaly.  Cardiovascular:     Rate and Rhythm: Normal rate and regular rhythm.     Heart sounds: Normal heart sounds.  Pulmonary:     Effort: Pulmonary effort is normal.     Breath sounds: Normal breath sounds.  Chest:     Breasts:        Right: Normal.        Left: Normal.     Comments: Normal bilat breast exam Abdominal:     General: Bowel sounds are normal.     Comments: Uterus involuted  Genitourinary:    Exam position: Lithotomy position.     Pubic Area: No rash.      Labia:        Left: No rash or lesion.    Vagina: No bleeding or lesions.     Cervix: No cervical motion tenderness, discharge or cervical bleeding.     Uterus: Normal. Not enlarged and not tender.      Adnexa: Right adnexa normal and left adnexa normal.  Musculoskeletal: Normal range of motion.     Comments: Nexplanon palpated in right arm;  Site healed well from insertion previous week;  Left arm healed well from Nexplanon removal x 2 last week.  Lymphadenopathy:     Lower Body: No right inguinal adenopathy. No left inguinal adenopathy.  Skin:    General: Skin is warm and dry.  Neurological:     Mental Status: She is alert and oriented to person, place, and time.  Psychiatric:        Mood and Affect: Mood normal.      Labs and Imaging No results found for this or any previous visit (from the past 24 hour(s)).  No results found.   Assessment & Plan:  1. Well Woman Exam - Cytology - PAP( [place])  2. Routine screening for STI (sexually transmitted infection) - HIV Antibody (routine testing w rflx) - RPR   Cyndee Brightly, CNM 10/09/2018 9:50 AM

## 2018-10-10 LAB — HIV ANTIBODY (ROUTINE TESTING W REFLEX): HIV Screen 4th Generation wRfx: NONREACTIVE

## 2018-10-10 LAB — RPR: RPR Ser Ql: NONREACTIVE

## 2018-10-13 LAB — CYTOLOGY - PAP
Adequacy: ABSENT
Diagnosis: NEGATIVE
HPV: NOT DETECTED

## 2019-06-18 ENCOUNTER — Encounter (HOSPITAL_COMMUNITY): Payer: Self-pay | Admitting: Pediatrics

## 2019-06-18 ENCOUNTER — Other Ambulatory Visit: Payer: Self-pay

## 2019-06-18 ENCOUNTER — Emergency Department (HOSPITAL_COMMUNITY): Payer: 59

## 2019-06-18 ENCOUNTER — Emergency Department (HOSPITAL_COMMUNITY)
Admission: EM | Admit: 2019-06-18 | Discharge: 2019-06-18 | Disposition: A | Discharge status: Home / Self Care | Payer: 59 | Attending: Emergency Medicine | Admitting: Emergency Medicine

## 2019-06-18 DIAGNOSIS — M549 Dorsalgia, unspecified: Secondary | ICD-10-CM | POA: Diagnosis not present

## 2019-06-18 DIAGNOSIS — R1030 Lower abdominal pain, unspecified: Secondary | ICD-10-CM | POA: Diagnosis present

## 2019-06-18 DIAGNOSIS — R112 Nausea with vomiting, unspecified: Secondary | ICD-10-CM | POA: Insufficient documentation

## 2019-06-18 DIAGNOSIS — N83201 Unspecified ovarian cyst, right side: Secondary | ICD-10-CM | POA: Diagnosis not present

## 2019-06-18 DIAGNOSIS — R102 Pelvic and perineal pain: Secondary | ICD-10-CM | POA: Insufficient documentation

## 2019-06-18 LAB — CBC
HCT: 40.3 % (ref 36.0–46.0)
Hemoglobin: 12.8 g/dL (ref 12.0–15.0)
MCH: 28.8 pg (ref 26.0–34.0)
MCHC: 31.8 g/dL (ref 30.0–36.0)
MCV: 90.8 fL (ref 80.0–100.0)
Platelets: 341 10*3/uL (ref 150–400)
RBC: 4.44 MIL/uL (ref 3.87–5.11)
RDW: 11.9 % (ref 11.5–15.5)
WBC: 6.3 10*3/uL (ref 4.0–10.5)
nRBC: 0 % (ref 0.0–0.2)

## 2019-06-18 LAB — COMPREHENSIVE METABOLIC PANEL
ALT: 10 U/L (ref 0–44)
AST: 14 U/L — ABNORMAL LOW (ref 15–41)
Albumin: 4 g/dL (ref 3.5–5.0)
Alkaline Phosphatase: 51 U/L (ref 38–126)
Anion gap: 12 (ref 5–15)
BUN: 5 mg/dL — ABNORMAL LOW (ref 6–20)
CO2: 23 mmol/L (ref 22–32)
Calcium: 9.3 mg/dL (ref 8.9–10.3)
Chloride: 104 mmol/L (ref 98–111)
Creatinine, Ser: 0.5 mg/dL (ref 0.44–1.00)
GFR calc Af Amer: >60 mL/min (ref >60)
GFR calc non Af Amer: >60 mL/min (ref >60)
Glucose, Bld: 108 mg/dL — ABNORMAL HIGH (ref 70–99)
Potassium: 3.7 mmol/L (ref 3.5–5.1)
Sodium: 139 mmol/L (ref 135–145)
Total Bilirubin: 0.7 mg/dL (ref 0.3–1.2)
Total Protein: 7 g/dL (ref 6.5–8.1)

## 2019-06-18 LAB — URINALYSIS, ROUTINE W REFLEX MICROSCOPIC
Bacteria, UA: NONE SEEN
Bilirubin Urine: NEGATIVE
Glucose, UA: NEGATIVE mg/dL
Ketones, ur: 80 mg/dL — AB
Leukocytes,Ua: NEGATIVE
Nitrite: NEGATIVE
Protein, ur: NEGATIVE mg/dL
Specific Gravity, Urine: >1.046 — ABNORMAL HIGH (ref 1.005–1.030)
pH: 6 (ref 5.0–8.0)

## 2019-06-18 LAB — WET PREP, GENITAL
Clue Cells Wet Prep HPF POC: NONE SEEN
Sperm: NONE SEEN
Trich, Wet Prep: NONE SEEN
Yeast Wet Prep HPF POC: NONE SEEN

## 2019-06-18 LAB — LIPASE, BLOOD: Lipase: 23 U/L (ref 11–51)

## 2019-06-18 LAB — I-STAT BETA HCG BLOOD, ED (MC, WL, AP ONLY): I-stat hCG, quantitative: <5 m[IU]/mL (ref <5)

## 2019-06-18 MED ORDER — ONDANSETRON HCL 4 MG/2ML IJ SOLN
4.0000 mg | Freq: Once | INTRAMUSCULAR | Status: AC
  Administered 2019-06-18: 4 mg via INTRAVENOUS
  Filled 2019-06-18: qty 2

## 2019-06-18 MED ORDER — IOHEXOL 300 MG/ML  SOLN
100.0000 mL | Freq: Once | INTRAMUSCULAR | Status: AC | PRN
  Administered 2019-06-18: 100 mL via INTRAVENOUS

## 2019-06-18 MED ORDER — ONDANSETRON HCL 4 MG/2ML IJ SOLN: 4.0000 mg | Freq: Once | INTRAMUSCULAR | Status: DC

## 2019-06-18 MED ORDER — MORPHINE SULFATE (PF) 2 MG/ML IV SOLN
2.0000 mg | Freq: Once | INTRAVENOUS | Status: AC
  Administered 2019-06-18: 2 mg via INTRAVENOUS
  Filled 2019-06-18: qty 1

## 2019-06-18 MED ORDER — IBUPROFEN 600 MG PO TABS: 600.0000 mg | ORAL_TABLET | Freq: Four times a day (QID) | ORAL | 0 refills | Status: AC | PRN

## 2019-06-18 MED ORDER — MORPHINE SULFATE (PF) 4 MG/ML IV SOLN
4.0000 mg | Freq: Once | INTRAVENOUS | Status: AC
  Administered 2019-06-18: 4 mg via INTRAVENOUS
  Filled 2019-06-18: qty 1

## 2019-06-18 MED ORDER — SODIUM CHLORIDE 0.9% FLUSH
3.0000 mL | Freq: Once | INTRAVENOUS | Status: AC
  Administered 2019-06-18: 3 mL via INTRAVENOUS

## 2019-06-18 MED ORDER — KETOROLAC TROMETHAMINE 15 MG/ML IJ SOLN
15.0000 mg | Freq: Once | INTRAMUSCULAR | Status: AC
  Administered 2019-06-18: 13:00:00 15 mg via INTRAVENOUS
  Filled 2019-06-18: qty 1

## 2019-06-18 MED ORDER — OXYCODONE-ACETAMINOPHEN 7.5-325 MG PO TABS: 1.0000 | ORAL_TABLET | Freq: Three times a day (TID) | ORAL | 0 refills | Status: DC | PRN

## 2019-06-18 NOTE — ED Notes (Signed)
Back from [place].

## 2019-06-18 NOTE — ED Provider Notes (Signed)
[facility] EMERGENCY DEPARTMENT Provider Note   CSN: [id number] Arrival date & time: 06/18/19  4970     History Chief Complaint  Patient presents with  . Abdominal Pain    Christina Vega is a 34 y.o. female who is [place] speaking (does NOT require a translator), presenting to the emergency department with lower abdominal pain, pelvic pain, and back pain.  She reports onset of symptoms this morning around 5 AM, waking her up from sleep.  She describes an intense pain in her mid pelvis that seems to wrap around to radiate to her bilateral flanks.  She has never had this kind of pain before.  She feels nauseated and dry heaved earlier.  She does have a Nexplanon implant from the end of last year.  However she continues to have her menstrual cycles despite the Nexplanon.  She just finished her menstrual cycle a day or 2 ago.  She is a history of constipation which has been seen in the ED before.  However she does not feel like this is related to her constipation.  She had regular bowel movement yesterday.  She does have a history of cesarean section with denies any other abdominal surgical history including appendectomy.  She currently denies fevers or chills. She denies any vaginal discharge.   HPI     History reviewed. No pertinent past medical history.  Patient Active Problem List   Diagnosis Date Noted  . Syncope 08/24/2015  . Viral illness 06/09/2015  . Muscle spasm of back 02/21/2015  . Hypoesthesia of skin 07/29/2014  . Nightmares 07/05/2014  . Pain in surgical scar 07/04/2014  . Refugee health examination 07/04/2014  . Language barrier: Primary language - [place]. Needs interpreter 07/04/2014    Past Surgical History:  Procedure Laterality Date  . CESAREAN SECTION       OB History    Gravida  5   Para      Term      Preterm      AB      Living  4     SAB      TAB      Ectopic      Multiple      Live Births  4           No  family history on file.  Social History   Tobacco Use  . Smoking status: Never Smoker  . Smokeless tobacco: Never Used  Substance Use Topics  . Alcohol use: No  . Drug use: No    Home Medications Prior to Admission medications   Medication Sig Start Date End Date Taking? Authorizing Provider  ibuprofen (ADVIL) 600 MG tablet Take 1 tablet (600 mg total) by mouth every 6 (six) hours as needed for up to 30 doses for mild pain or moderate pain. 06/18/19   Terald Sleeper, MD  oxyCODONE-acetaminophen (PERCOCET) 7.5-325 MG tablet Take 1 tablet by mouth every 8 (eight) hours as needed for up to 5 doses for severe pain. 06/18/19   Terald Sleeper, MD    Allergies    Patient has no known allergies.  Review of Systems   Review of Systems  Constitutional: Negative for chills and fever.  Respiratory: Negative for cough and shortness of breath.   Cardiovascular: Negative for chest pain and palpitations.  Gastrointestinal: Positive for abdominal pain, nausea and vomiting. Negative for constipation and diarrhea.  Genitourinary: Positive for pelvic pain. Negative for dysuria, hematuria and vaginal bleeding.  Musculoskeletal: Positive for back pain. Negative for arthralgias.  Skin: Negative for color change and rash.  Neurological: Negative for syncope and light-headedness.  Psychiatric/Behavioral: Negative for agitation and confusion.  All other systems reviewed and are negative.   Physical Exam Updated Vital Signs BP 117/69 (BP Location: Right Arm)   Pulse 65   Temp 98.1 F (36.7 C) (Oral)   Resp 17   Ht 5\' 5"  (1.651 m)   SpO2 100%   BMI 31.12 kg/m   Physical Exam Vitals and nursing note reviewed.  Constitutional:      General: She is not in acute distress.    Appearance: She is well-developed.  HENT:     Head: Normocephalic and atraumatic.  Eyes:     Conjunctiva/sclera: Conjunctivae normal.  Cardiovascular:     Rate and Rhythm: Normal rate and regular rhythm.     Heart  sounds: No murmur.  Pulmonary:     Effort: Pulmonary effort is normal. No respiratory distress.     Breath sounds: Normal breath sounds.  Abdominal:     Palpations: Abdomen is soft.     Tenderness: There is generalized abdominal tenderness. There is no guarding or rebound.     Comments: Generalized abdominal discomfort without rigidity, guarding, or distension Most focal discomfort is suprapubic  Genitourinary:    Comments: Exam performed with chaperone present. External: Normal external female genitalia. No lesions, rashes, drainage, or suspicious lymph nodes. Internal: No CMT. Cervix closed and without erythema. Scant blood pooled in vaginal vault. No adnexal tenderness, swelling, or masses. No lacerations. No foreign bodies.  Musculoskeletal:     Cervical back: Neck supple.  Skin:    General: Skin is warm and dry.     Coloration: Cyanotic:    Neurological:     Mental Status: She is alert.  Psychiatric:        Mood and Affect: Mood normal.        Behavior: Behavior normal.     ED Results / Procedures / Treatments   Labs (all labs ordered are listed, but only abnormal results are displayed) Labs Reviewed  WET PREP, GENITAL - Abnormal; Notable for the following components:      Result Value   WBC, Wet Prep HPF POC RARE (*)    All other components within normal limits  COMPREHENSIVE METABOLIC PANEL - Abnormal; Notable for the following components:   Glucose, Bld 108 (*)    BUN 5 (*)    AST 14 (*)    All other components within normal limits  URINALYSIS, ROUTINE W REFLEX MICROSCOPIC - Abnormal; Notable for the following components:   Specific Gravity, Urine >1.046 (*)    Hgb urine dipstick MODERATE (*)    Ketones, ur 80 (*)    All other components within normal limits  LIPASE, BLOOD  CBC  I-STAT BETA HCG BLOOD, ED ([facility], [facility], [facility] ONLY)  GC/CHLAMYDIA PROBE AMP ([place]) NOT AT [facility]    EKG None  Radiology CT ABDOMEN PELVIS W CONTRAST  Result Date: 06/18/2019  CLINICAL DATA:  Right lower quadrant abdominal pain EXAM: CT ABDOMEN AND PELVIS WITH CONTRAST TECHNIQUE: Multidetector CT imaging of the abdomen and pelvis was performed using the standard protocol following bolus administration of intravenous contrast. CONTRAST:  06/20/2019 OMNIPAQUE IOHEXOL 300 MG/ML  SOLN COMPARISON:  12/06/2016 CT, 06/18/2019 ultrasound FINDINGS: Lower chest: No acute abnormality. Hepatobiliary: No focal liver abnormality is seen. No gallstones, gallbladder wall thickening, or biliary dilatation. Pancreas: Unremarkable. No pancreatic ductal dilatation or surrounding  inflammatory changes. Spleen: Normal in size without focal abnormality. Adrenals/Urinary Tract: Adrenal glands are unremarkable. Kidneys are normal, without renal calculi, focal lesion, or hydronephrosis. Bladder is unremarkable. Stomach/Bowel: Stomach is within normal limits. Appendix appears normal (series 6, images 30-33). No evidence of bowel wall thickening, distention, or inflammatory changes. Vascular/Lymphatic: No significant vascular findings are present. No enlarged abdominal or pelvic lymph nodes. Reproductive: Unremarkable anteverted uterus. Two adjacent right adnexal cysts measuring approximately 4.4 cm and 4.0 cm, respectively. Unremarkable left adnexa. Other: Trace free fluid within the pelvis, likely physiologic. No free air. No abdominal wall hernia. Musculoskeletal: No acute or significant osseous findings. IMPRESSION: 1. No acute intra-abdominal findings.  Normal appendix. 2. Two adjacent right adnexal cysts measuring up to 4.4 cm, better characterized on dedicated pelvic ultrasound. Electronically Signed   By: Davina Poke D.O.   On: 06/18/2019 12:29   US PELVIC COMPLETE W TRANSVAGINAL AND TORSION R/O  Result Date: 06/18/2019 CLINICAL DATA:  34 year old with pelvic pain. EXAM: TRANSABDOMINAL AND TRANSVAGINAL ULTRASOUND OF PELVIS DOPPLER ULTRASOUND OF OVARIES TECHNIQUE: Both transabdominal and transvaginal  ultrasound examinations of the pelvis were performed. Transabdominal technique was performed for global imaging of the pelvis including uterus, ovaries, adnexal regions, and pelvic cul-de-sac. It was necessary to proceed with endovaginal exam following the transabdominal exam to visualize the ovaries. Color and duplex Doppler ultrasound was utilized to evaluate blood flow to the ovaries. COMPARISON:  None. FINDINGS: Uterus Measurements: 9.6 x 4.9 x 4.8 cm = volume: 118 mL. No fibroids or other mass visualized. Endometrium Thickness: 0.9 cm.  No focal abnormality visualized. Right ovary Measurements: 9.2 x 5.3 x 7.3 cm = volume: 186 mL. Two large cystic structures involving the right ovary. One structure measures 3.6 x 4.1 x 4.4 cm and the other measures 4.5 x 3.8 x 3.9 cm. There appears to be large septation or ovarian tissue separating these two large cystic structures. There is arterial and venous flow in the tissue surrounding the large cystic structures. The large cystic structures are relatively simple. Left ovary Measurements: 3.1 x 1.3 x 2.4 cm = volume: 4.9 mL. Normal appearance/no adnexal mass. Pulsed Doppler evaluation of both ovaries demonstrates normal low-resistance arterial and venous waveforms. Other findings Trace free fluid. IMPRESSION: 1. Right ovary is markedly enlarged and contains two large cystic structures or cystic components. The cysts appear to be relatively simple. Due to the large size of the right ovary, consider gynecology consultation with regards to management. 2. No evidence for ovarian torsion. There is arterial and venous flow in both ovaries. 3. Normal appearance of the uterus. Electronically Signed   By: Markus Daft M.D.   On: 06/18/2019 10:54    Procedures Procedures (including critical care time)  Medications Ordered in ED Medications  ondansetron (ZOFRAN) injection 4 mg (0 mg Intravenous Hold 06/18/19 1312)  sodium chloride flush (NS) 0.9 % injection 3 mL (3 mLs  Intravenous Given 06/18/19 0901)  morphine 4 MG/ML injection 4 mg (4 mg Intravenous Given 06/18/19 0909)  ondansetron (ZOFRAN) injection 4 mg (4 mg Intravenous Given 06/18/19 0908)  morphine 2 MG/ML injection 2 mg (2 mg Intravenous Given 06/18/19 1312)  ketorolac (TORADOL) 15 MG/ML injection 15 mg (15 mg Intravenous Given 06/18/19 1310)  iohexol (OMNIPAQUE) 300 MG/ML solution 100 mL (100 mLs Intravenous Contrast Given 06/18/19 1200)  ondansetron (ZOFRAN) injection 4 mg (4 mg Intravenous Given 06/18/19 1309)    ED Course  I have reviewed the triage vital signs and the nursing notes.  Pertinent labs &  imaging results that were available during my care of the patient were reviewed by me and considered in my medical decision making (see chart for details).  34 yo female here with lower abdominal pain onset 0500 while sleeping, with some nausea/vomiting.  She appears uncomfortable in the room but is not tachycardic, diaphoretic, or writhing.  No rigidity of the abdomen to suggest peritonitis or perforation at this time.  GU exam demonstrates only scant blood pooled in vaginal vault consistent with her recent menses completion.  No CMT or adnexal tenderness.  She feels her presenting pain was only "mildly" reproduced by my digital exam.  DDx includes cystitis vs. Ovarian cyst vs. Ovarian torsion vs. Kidney stone vs. Constipation vs. Appendicitis  Labs pending, will give IV morphine and IV zofran, NPO for now  Clinical Course as of Jun 17 1820  Fri Jun 18, 2019  0846 Pain is returning.  We'll get a CT abdomen to evaluate for appendicitis, give a smaller dose of morphine and some toradol now, and reassess.   [MT]  [id number] Feeling better after morphine   [MT]  1100  IMPRESSION: 1. Right ovary is markedly enlarged and contains two large cystic structures or cystic components. The cysts appear to be relatively simple. Due to the large size of the right ovary, consider gynecology consultation with regards  to management. 2. No evidence for ovarian torsion. There is arterial and venous flow in both ovaries. 3. Normal appearance of the uterus.   [MT]  1335 Spoke to OBGYn attending who reports torsion less likely with a cyst this enlarged as it is less likely to twist.  If she has blood flow and pain is under control, can d/c on motrin and heating pads, advised office follow up in 1-2 weeks, which she will help arrange.   [MT]  1352 Her pain is much better under control.  We discussed using Motrin for several days.  She follow-up with the OB/GYN.   [MT]    Clinical Course User Index [MT] Ziare Cryder, Kermit Balo, MD    Final Clinical Impression(s) / ED Diagnoses Final diagnoses:  Lower abdominal pain  Cyst of right ovary    Rx / DC Orders ED Discharge Orders         Ordered    oxyCODONE-acetaminophen (PERCOCET) 7.5-325 MG tablet  Every 8 hours PRN     06/18/19 1334    ibuprofen (ADVIL) 600 MG tablet  Every 6 hours PRN     06/18/19 1334           Maryelizabeth Eberle, Kermit Balo, MD 06/18/19 1824

## 2019-06-18 NOTE — ED Notes (Signed)
Patient transported to Ultrasound 

## 2019-06-18 NOTE — ED Triage Notes (Signed)
C/o severe abdominal pain since 5 am; along w/ N/V. Reported LBM 06/17/2019

## 2019-06-18 NOTE — ED Notes (Signed)
Ask patient for urine sample, patient stated that she did not need to urinate at this time. 

## 2019-06-18 NOTE — Discharge Instructions (Signed)
Your right ovary is large.  You need to follow up with the [facility]YN [facility] in 1-2 weeks.  They may call you in the next 2-3 days to make an appointment: if you don't hear from them by Monday, call the number above to ask for a follow up appointment.  You should take MOTRIN 600 mg every 6 hours for the next 7 days for your pain.  Try heating packs on your belly as well.

## 2019-06-19 ENCOUNTER — Emergency Department (HOSPITAL_COMMUNITY)
Admission: EM | Admit: 2019-06-19 | Discharge: 2019-06-19 | Disposition: A | Discharge status: Home / Self Care | Payer: 59 | Attending: Emergency Medicine | Admitting: Emergency Medicine

## 2019-06-19 ENCOUNTER — Other Ambulatory Visit: Payer: Self-pay

## 2019-06-19 DIAGNOSIS — R112 Nausea with vomiting, unspecified: Secondary | ICD-10-CM | POA: Insufficient documentation

## 2019-06-19 DIAGNOSIS — R1031 Right lower quadrant pain: Secondary | ICD-10-CM | POA: Diagnosis present

## 2019-06-19 DIAGNOSIS — N939 Abnormal uterine and vaginal bleeding, unspecified: Secondary | ICD-10-CM | POA: Diagnosis not present

## 2019-06-19 DIAGNOSIS — N83201 Unspecified ovarian cyst, right side: Secondary | ICD-10-CM | POA: Diagnosis not present

## 2019-06-19 LAB — COMPREHENSIVE METABOLIC PANEL
ALT: 10 U/L (ref 0–44)
AST: 13 U/L — ABNORMAL LOW (ref 15–41)
Albumin: 3.7 g/dL (ref 3.5–5.0)
Alkaline Phosphatase: 50 U/L (ref 38–126)
Anion gap: 12 (ref 5–15)
BUN: 7 mg/dL (ref 6–20)
CO2: 23 mmol/L (ref 22–32)
Calcium: 8.8 mg/dL — ABNORMAL LOW (ref 8.9–10.3)
Chloride: 102 mmol/L (ref 98–111)
Creatinine, Ser: 0.51 mg/dL (ref 0.44–1.00)
GFR calc Af Amer: >60 mL/min (ref >60)
GFR calc non Af Amer: >60 mL/min (ref >60)
Glucose, Bld: 97 mg/dL (ref 70–99)
Potassium: 3.2 mmol/L — ABNORMAL LOW (ref 3.5–5.1)
Sodium: 137 mmol/L (ref 135–145)
Total Bilirubin: 0.9 mg/dL (ref 0.3–1.2)
Total Protein: 6.6 g/dL (ref 6.5–8.1)

## 2019-06-19 LAB — URINALYSIS, ROUTINE W REFLEX MICROSCOPIC
Bilirubin Urine: NEGATIVE
Glucose, UA: NEGATIVE mg/dL
Ketones, ur: 80 mg/dL — AB
Leukocytes,Ua: NEGATIVE
Nitrite: NEGATIVE
Protein, ur: 100 mg/dL — AB
RBC / HPF: >50 RBC/hpf — ABNORMAL HIGH (ref 0–5)
Specific Gravity, Urine: 1.036 — ABNORMAL HIGH (ref 1.005–1.030)
pH: 5 (ref 5.0–8.0)

## 2019-06-19 LAB — GC/CHLAMYDIA PROBE AMP (~~LOC~~) NOT AT ARMC
Chlamydia: NEGATIVE
Neisseria Gonorrhea: NEGATIVE

## 2019-06-19 LAB — CBC
HCT: 38.6 % (ref 36.0–46.0)
Hemoglobin: 12.4 g/dL (ref 12.0–15.0)
MCH: 29 pg (ref 26.0–34.0)
MCHC: 32.1 g/dL (ref 30.0–36.0)
MCV: 90.2 fL (ref 80.0–100.0)
Platelets: 340 10*3/uL (ref 150–400)
RBC: 4.28 MIL/uL (ref 3.87–5.11)
RDW: 12 % (ref 11.5–15.5)
WBC: 10.1 10*3/uL (ref 4.0–10.5)
nRBC: 0 % (ref 0.0–0.2)

## 2019-06-19 LAB — LIPASE, BLOOD: Lipase: 21 U/L (ref 11–51)

## 2019-06-19 LAB — I-STAT BETA HCG BLOOD, ED (MC, WL, AP ONLY): I-stat hCG, quantitative: <5 m[IU]/mL (ref <5)

## 2019-06-19 MED ORDER — MORPHINE SULFATE (PF) 4 MG/ML IV SOLN
4.0000 mg | Freq: Once | INTRAVENOUS | Status: AC
  Administered 2019-06-19: 05:00:00 4 mg via INTRAVENOUS
  Filled 2019-06-19: qty 1

## 2019-06-19 MED ORDER — SODIUM CHLORIDE 0.9 % IV BOLUS (SEPSIS)
1000.0000 mL | Freq: Once | INTRAVENOUS | Status: AC
  Administered 2019-06-19: 05:00:00 1000 mL via INTRAVENOUS

## 2019-06-19 MED ORDER — ONDANSETRON HCL 4 MG/2ML IJ SOLN
4.0000 mg | Freq: Once | INTRAMUSCULAR | Status: AC
  Administered 2019-06-19: 05:00:00 4 mg via INTRAVENOUS
  Filled 2019-06-19: qty 2

## 2019-06-19 MED ORDER — ONDANSETRON 4 MG PO TBDP: 4.0000 mg | ORAL_TABLET | Freq: Four times a day (QID) | ORAL | 0 refills | Status: DC | PRN

## 2019-06-19 MED ORDER — SODIUM CHLORIDE 0.9% FLUSH: 3.0000 mL | Freq: Once | INTRAVENOUS | Status: DC

## 2019-06-19 NOTE — ED Triage Notes (Signed)
Per pt she was here yesterday with abdominal pain, nausea, and vomiting. Pt said she is still having the pain.and did not go get her meds bc the prescriptions were not there.

## 2019-06-19 NOTE — Discharge Instructions (Signed)
I have called your pharmacy and confirmed that both of your prescriptions are at your pharmacy ([facility] at the corner of [address] and [address]).  They have not been processed yet.  The pharmacy opens at 8 AM.  Please follow-up with your OB/GYN as recommended by physician yesterday for your ovarian cysts.

## 2019-06-19 NOTE — ED Notes (Signed)
Pt states she is here to get medications for right lower abdominal pain.

## 2019-06-19 NOTE — ED Notes (Signed)
Pt has been able to tolerate PO fluids. Pt states improvement of pain following IV morphine from 10 to 2 out of 10. Denies Nausea.

## 2019-06-19 NOTE — ED Notes (Signed)
Discharge instructions discussed with pt. Pt verbalized understanding. Discussed pharmacy pickup location and time. No other questions at this time. Pt ambulatory to go home with friend

## 2019-06-19 NOTE — ED Provider Notes (Signed)
TIME SEEN: 5:04 AM  CHIEF COMPLAINT: Right lower quadrant abdominal pain  HPI: Patient is a 34 year old female who presents to the emergency department with persistent right lower quadrant abdominal pain ongoing for 2 days.  Has had some amount of vaginal bleeding without discharge.  No dysuria.  Has had nausea and vomiting with pain but no diarrhea.  No fever.  Was seen here yesterday for the same and had a CT scan that showed normal appendix.  Transvaginal ultrasound showed right ovarian cyst without torsion.  OB/GYN was consulted and recommended outpatient follow-up.  Patient reports the only reason she is here is because she went to the pharmacy and the oxycodone and ibuprofen that were prescribed for not at the pharmacy.  She is here for pain and nausea control.  She denies any change in her pain.  She denies any new symptoms.  States her friend drove her here to the emergency department.  ROS: See HPI Constitutional: no fever  Eyes: no drainage  ENT: no runny nose   Cardiovascular:  no chest pain  Resp: no SOB  GI: vomiting GU: no dysuria Integumentary: no rash  Allergy: no hives  Musculoskeletal: no leg swelling  Neurological: no slurred speech ROS otherwise negative  PAST MEDICAL HISTORY/PAST SURGICAL HISTORY:  No past medical history on file.  MEDICATIONS:  Prior to Admission medications   Medication Sig Start Date End Date Taking? Authorizing Provider  ibuprofen (ADVIL) 600 MG tablet Take 1 tablet (600 mg total) by mouth every 6 (six) hours as needed for up to 30 doses for mild pain or moderate pain. 06/18/19   Terald Sleeper, MD  oxyCODONE-acetaminophen (PERCOCET) 7.5-325 MG tablet Take 1 tablet by mouth every 8 (eight) hours as needed for up to 5 doses for severe pain. 06/18/19   Terald Sleeper, MD    ALLERGIES:  No Known Allergies  SOCIAL HISTORY:  Social History   Tobacco Use  . Smoking status: Never Smoker  . Smokeless tobacco: Never Used  Substance Use  Topics  . Alcohol use: No    FAMILY HISTORY: No family history on file.  EXAM: BP 115/68 (BP Location: Right Arm)   Pulse 71   Temp 97.6 F (36.4 C) (Oral)   Resp 20   SpO2 97%  CONSTITUTIONAL: Alert and oriented and responds appropriately to questions. Well-appearing; well-nourished, afebrile, in no distress, appears comfortable lying in bed, nontoxic-appearing, not actively vomiting HEAD: Normocephalic EYES: Conjunctivae clear, pupils appear equal, EOM appear intact ENT: normal nose; moist mucous membranes NECK: Supple, normal ROM CARD: RRR; S1 and S2 appreciated; no murmurs, no clicks, no rubs, no gallops RESP: Normal chest excursion without splinting or tachypnea; breath sounds clear and equal bilaterally; no wheezes, no rhonchi, no rales, no hypoxia or respiratory distress, speaking full sentences ABD/GI: Normal bowel sounds; non-distended; soft, tender in the right pelvic region, no rebound, no guarding, no peritoneal signs, no hepatosplenomegaly BACK:  The back appears normal EXT: Normal ROM in all joints; no deformity noted, no edema; no cyanosis SKIN: Normal color for age and race; warm; no rash on exposed skin NEURO: Moves all extremities equally PSYCH: The patient's mood and manner are appropriate.   MEDICAL DECISION MAKING: Patient here with pain from right ovarian cyst.  No change in her pain.  She looks very comfortable and I doubt that she has a torsion today.  She had an ultrasound yesterday that showed normal blood flow.  I do not feel this needs to be repeated.  Wet prep showed no sign of infection.  Her urine today does not look infected but does show large amount of blood but likely secondary to her vaginal bleeding.  She does have large ketones and therefore will give 2 L of IV fluids.  We will treat symptoms with morphine, Zofran.  Discussed with patient that I am able to see a prescription for ibuprofen 600 mg and Percocet 7.5/325 mg that were sent to [facility] on  [address] yesterday.  I have called this pharmacy and confirmed through the automated line that both of these prescriptions are listed under the patient's name under her spouse's phone number and are currently being processed.  Pharmacy opens at 8 AM.  ED PROGRESS: 6:10 AM  Pt continues to appear comfortable reports feeling better.  Discussed with patient that I have confirmed that her prescriptions are at her pharmacy and she will follow-up with them today.  Recommended continued follow-up with OB/GYN and discussed return precautions.  She is comfortable with this plan.  Will discharge once 2 L IV fluids complete.   At this time, I do not feel there is any life-threatening condition present. I have reviewed, interpreted and discussed all results (EKG, imaging, lab, urine as appropriate) and exam findings with patient/family. I have reviewed nursing notes and appropriate previous records.  I feel the patient is safe to be discharged home without further emergent workup and can continue workup as an outpatient as needed. Discussed usual and customary return precautions. Patient/family verbalize understanding and are comfortable with this plan.  Outpatient follow-up has been provided as needed. All questions have been answered.    Christina Vega was evaluated in Emergency Department on 06/19/2019 for the symptoms described in the history of present illness. She was evaluated in the context of the global COVID-19 pandemic, which necessitated consideration that the patient might be at risk for infection with the SARS-CoV-2 virus that causes COVID-19. Institutional protocols and algorithms that pertain to the evaluation of patients at risk for COVID-19 are in a state of rapid change based on information released by regulatory bodies including the CDC and federal and state organizations. These policies and algorithms were followed during the patient's care in the ED.  Patient was seen  wearing N95, face shield, gloves.    [name], Delice Bison, DO 06/19/19 [phone]

## 2019-06-21 ENCOUNTER — Ambulatory Visit: Payer: BLUE CROSS/BLUE SHIELD

## 2019-07-01 ENCOUNTER — Ambulatory Visit (INDEPENDENT_AMBULATORY_CARE_PROVIDER_SITE_OTHER): Payer: 59 | Admitting: Obstetrics and Gynecology

## 2019-07-01 ENCOUNTER — Encounter: Payer: Self-pay | Admitting: Family Medicine

## 2019-07-01 ENCOUNTER — Other Ambulatory Visit: Payer: Self-pay

## 2019-07-01 ENCOUNTER — Encounter: Payer: Self-pay | Admitting: Obstetrics and Gynecology

## 2019-07-01 DIAGNOSIS — N83201 Unspecified ovarian cyst, right side: Secondary | ICD-10-CM

## 2019-07-01 DIAGNOSIS — N83209 Unspecified ovarian cyst, unspecified side: Secondary | ICD-10-CM | POA: Insufficient documentation

## 2019-07-01 DIAGNOSIS — Z975 Presence of (intrauterine) contraceptive device: Secondary | ICD-10-CM | POA: Insufficient documentation

## 2019-07-01 NOTE — Patient Instructions (Signed)
Ovarian Cyst An ovarian cyst is a fluid-filled sac on an ovary. The ovaries are organs that make eggs in women. Most ovarian cysts go away on their own and are not cancerous (are benign). Some cysts need treatment. Follow these instructions at home:  Take over-the-counter and prescription medicines only as told by your doctor.  Do not drive or use heavy machinery while taking prescription pain medicine.  Get pelvic exams and Pap tests as often as told by your doctor.  Return to your normal activities as told by your doctor. Ask your doctor what activities are safe for you.  Do not use any products that contain nicotine or tobacco, such as cigarettes and e-cigarettes. If you need help quitting, ask your doctor.  Keep all follow-up visits as told by your doctor. This is important. Contact a doctor if:  Your periods are: ? Late. ? Irregular. ? Painful.   Your periods stop.  You have pelvic pain that does not go away.  You have pressure on your bladder.  You have trouble making your bladder empty when you pee (urinate).  You have pain during sex.  You have any of the following in your belly (abdomen): ? A feeling of fullness. ? Pressure. ? Discomfort. ? Pain that does not go away. ? Swelling.  You feel sick most of the time.  You have trouble pooping (have constipation).  You are not as hungry as usual (you lose your appetite).  You get very bad acne.  You start to have more hair on your body and face.  You are gaining weight or losing weight without changing your exercise and eating habits.  You think you may be pregnant. Get help right away if:  You have belly pain that is very bad or gets worse.  You cannot eat or drink without throwing up (vomiting).  You suddenly get a fever.  Your period is a lot heavier than usual. This information is not intended to replace advice given to you by your health care provider. Make sure you discuss any questions you have  with your health care provider. Document Revised: 02/21/2017 Document Reviewed: 08/13/2015 Elsevier Patient Education  2020 Elsevier Inc.  

## 2019-07-01 NOTE — Progress Notes (Signed)
Ms Doering presents for ER f/u of 06/18/19. Dx with ovarian cysts at that time. She reports that her pain has resolved now. Cycles are irregular with Nexplanon.  She denies any bowel or bladder dysfunction.  PE AF VSS Lungs clear Heart RRR Abd soft + BS GU Nl EGBUS uterus small, mobile non tender, adnexal tenderness  A/P Ovarian cyst  Dx reviewed with pt. Will check GYN U/S in 6 weeks. F/U per U/S results.

## 2019-07-08 ENCOUNTER — Other Ambulatory Visit: Payer: Self-pay | Admitting: Obstetrics and Gynecology

## 2019-07-08 ENCOUNTER — Ambulatory Visit (HOSPITAL_COMMUNITY)
Admission: RE | Admit: 2019-07-08 | Discharge: 2019-07-08 | Disposition: A | Discharge status: Home / Self Care | Payer: 59 | Source: Ambulatory Visit | Attending: Obstetrics and Gynecology | Admitting: Obstetrics and Gynecology

## 2019-07-08 ENCOUNTER — Other Ambulatory Visit: Payer: Self-pay

## 2019-07-08 DIAGNOSIS — N83201 Unspecified ovarian cyst, right side: Secondary | ICD-10-CM

## 2019-07-13 ENCOUNTER — Telehealth (INDEPENDENT_AMBULATORY_CARE_PROVIDER_SITE_OTHER): Payer: 59

## 2019-07-13 DIAGNOSIS — N83292 Other ovarian cyst, left side: Secondary | ICD-10-CM

## 2019-07-13 NOTE — Telephone Encounter (Addendum)
-----   Message from Hermina Staggers, MD sent at 07/12/2019 12:40 PM EDT ----- Please let Ms Babilonia know that the cyst on her right ovary has resolved. There is a simple cyst on her left ovary which is normal and no additional follow up is needed.  Thanks Jamesetta Orleans pt with [facility] interpreter Maralyn Sago ID [phone]. Results given. Explained pt can follow up as needed with our office. Pt asks if it is okay to try for pregnancy. Per chart review pt has Nexplanon; explained pt will need to schedule appt for removal if she would like to become pregnant.

## 2020-04-03 ENCOUNTER — Other Ambulatory Visit: Payer: 59

## 2020-04-03 DIAGNOSIS — Z20822 Contact with and (suspected) exposure to covid-19: Secondary | ICD-10-CM

## 2020-04-06 LAB — NOVEL CORONAVIRUS, NAA: SARS-CoV-2, NAA: NOT DETECTED

## 2020-04-06 LAB — SARS-COV-2, NAA 2 DAY TAT

## 2020-04-10 ENCOUNTER — Other Ambulatory Visit: Payer: 59

## 2021-01-21 IMAGING — US US PELVIS COMPLETE TRANSABD/TRANSVAG W DUPLEX
1 series · 13 of 25 positions shown · non-contrast
Comparison: None.

CLINICAL DATA: 34-year-old with pelvic pain.

EXAM:
TRANSABDOMINAL AND TRANSVAGINAL ULTRASOUND OF PELVIS
DOPPLER ULTRASOUND OF OVARIES
TECHNIQUE: Both transabdominal and transvaginal ultrasound examinations of the
pelvis were performed. Transabdominal technique was performed for
global imaging of the pelvis including uterus, ovaries, adnexal
regions, and pelvic cul-de-sac.
It was necessary to proceed with endovaginal exam following the
transabdominal exam to visualize the ovaries. Color and duplex
Doppler ultrasound was utilized to evaluate blood flow to the
ovaries.

[Series 1: us pelvic complete w transvaginal and torsion righ · 137 acquisitions, 13 frames shown]
[im 1/137]
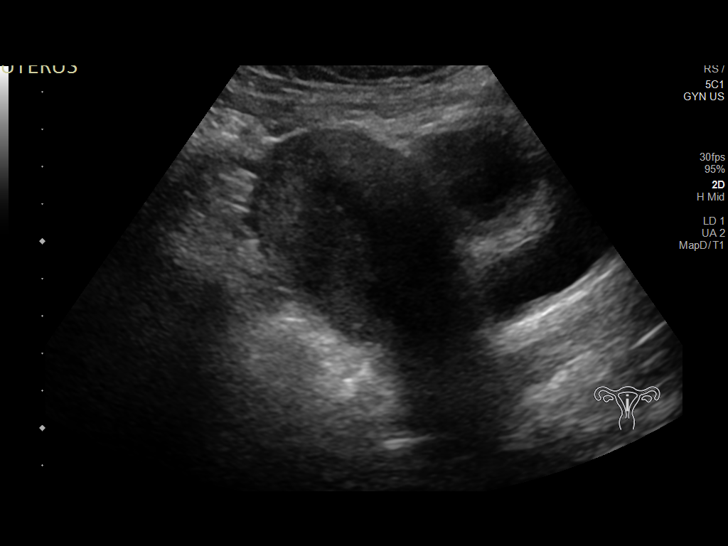
[im 12/137]
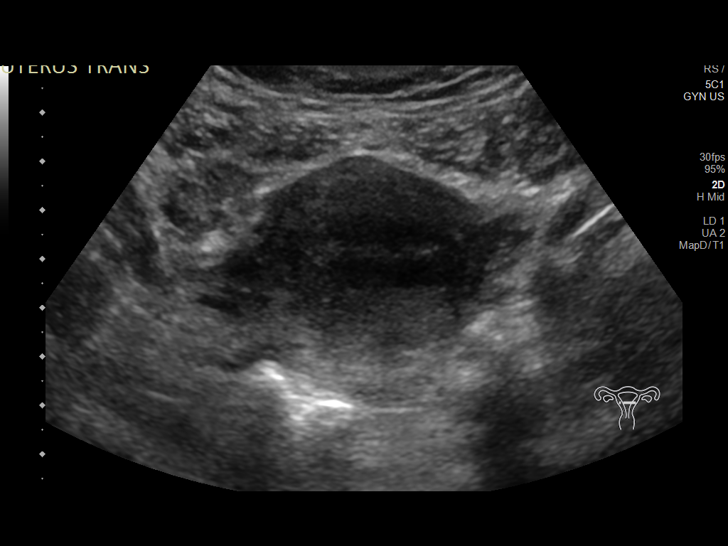
[im 23/137]
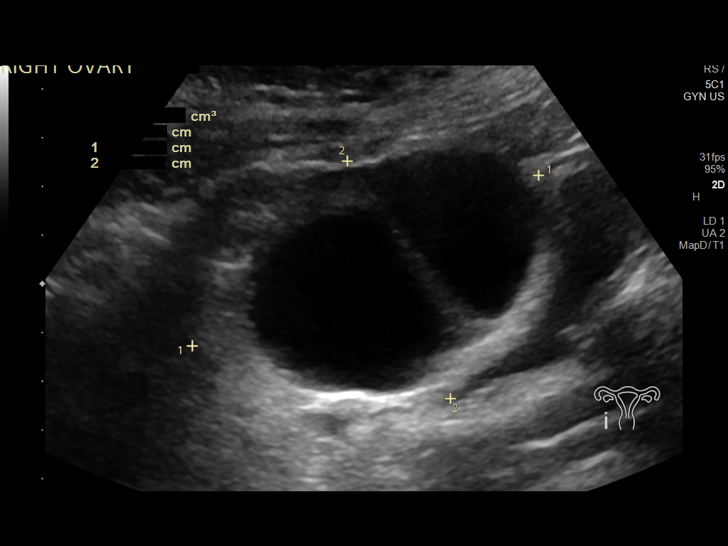
[im 35/137]
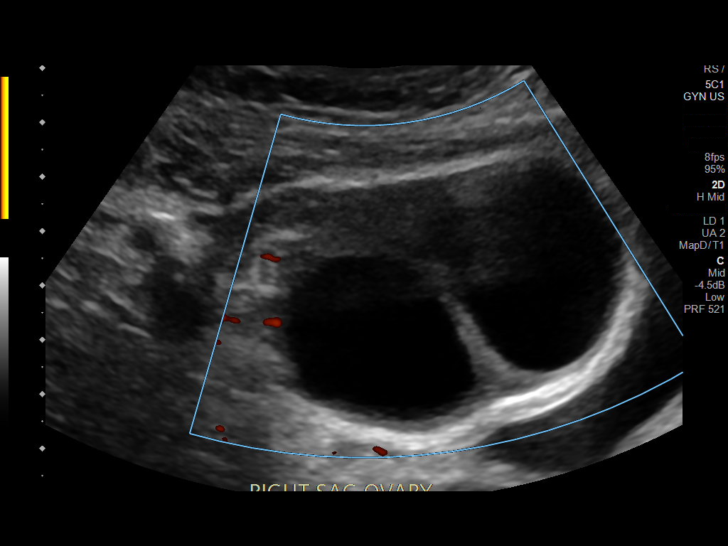
[im 46/137]
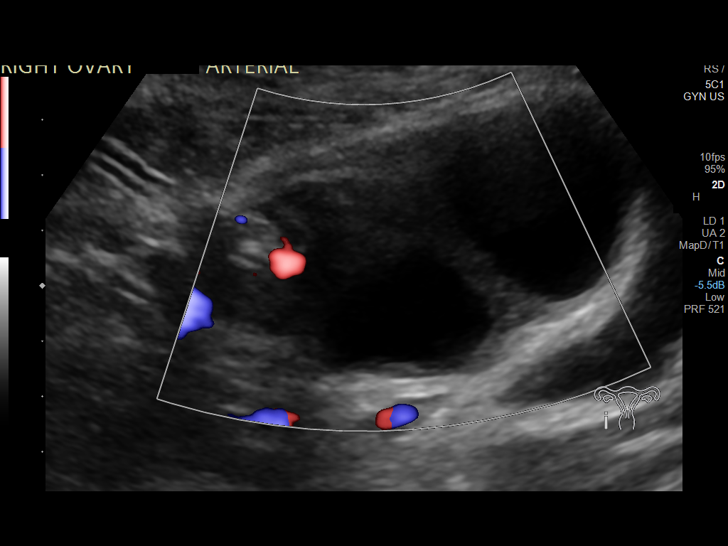
[im 57/137]
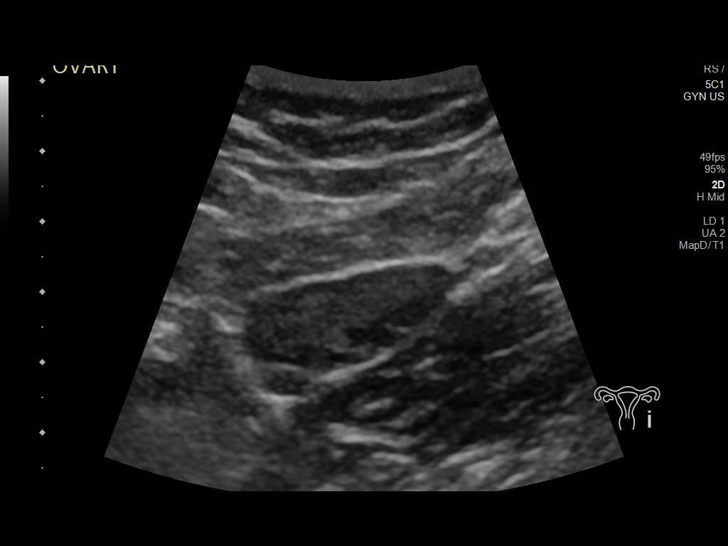
[im 69/137]
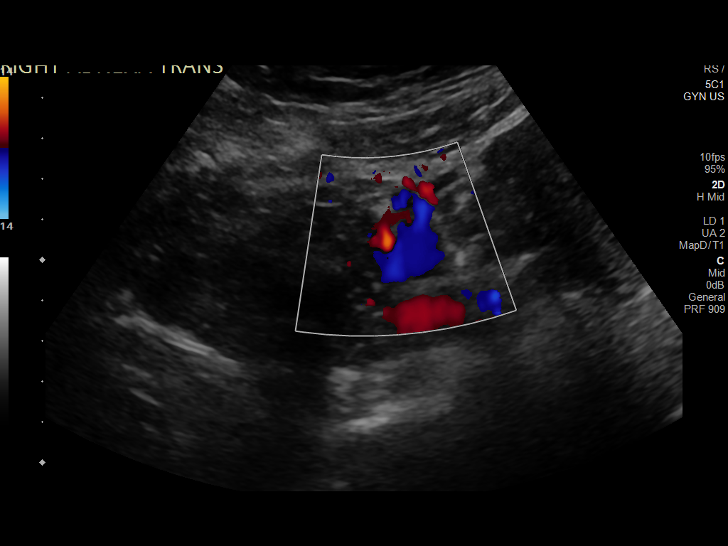
[im 80/137]
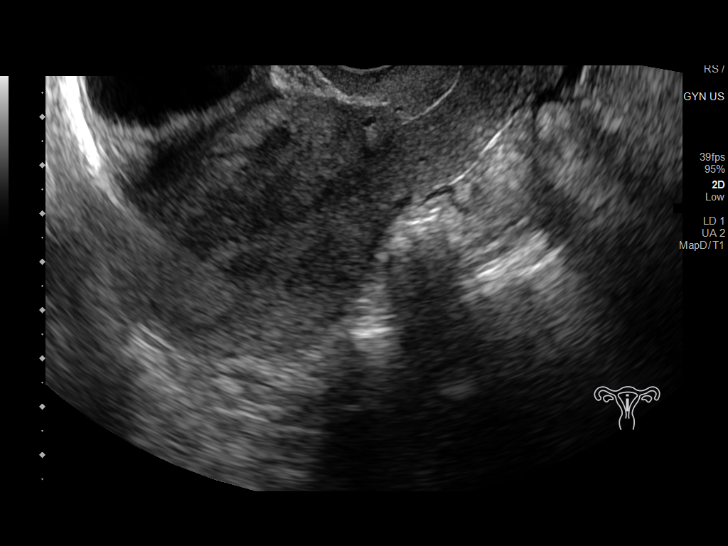
[im 91/137]
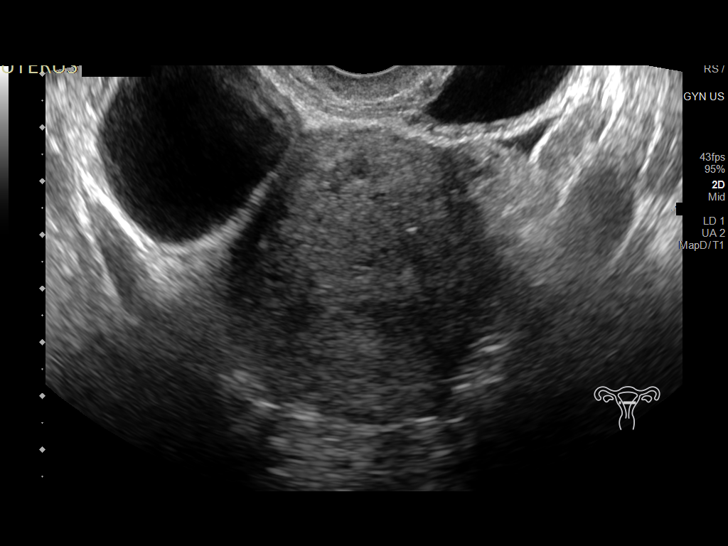
[im 103/137]
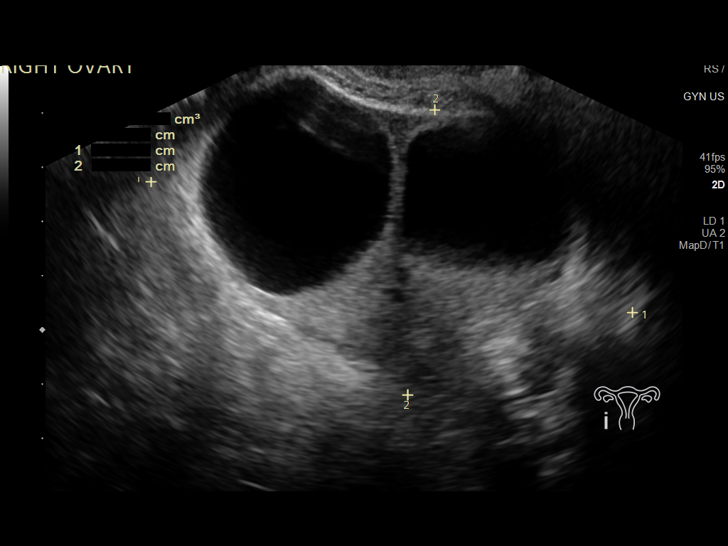
[im 114/137]
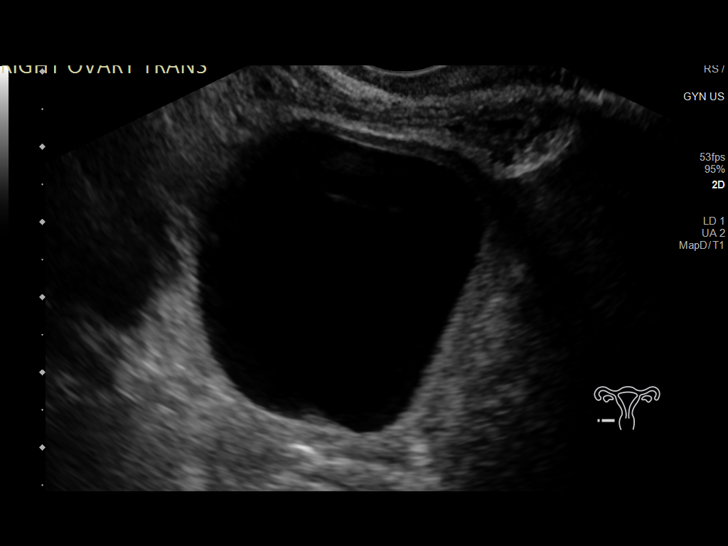
[im 125/137]
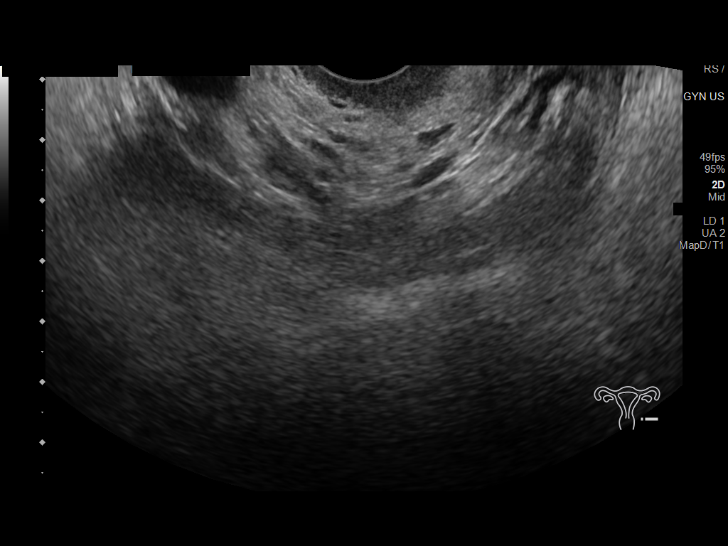
[im 137/137]
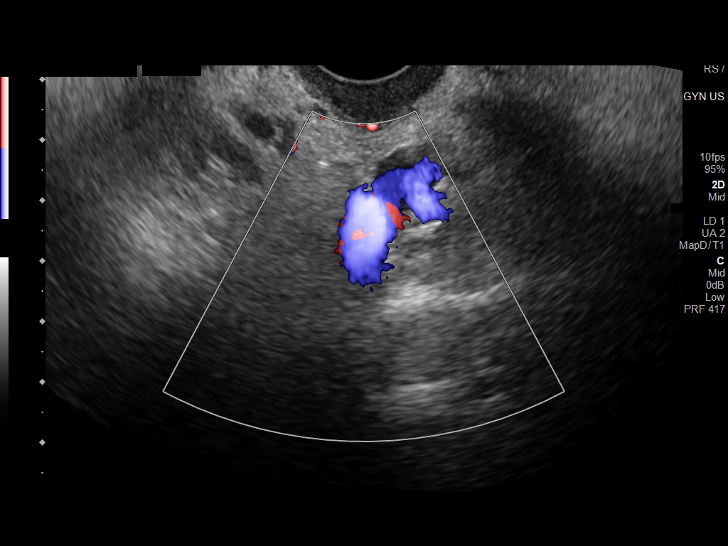

[13 of 25 positions shown; findings below may reference images not displayed]

FINDINGS: Uterus

Measurements: 9.6 x 4.9 x 4.8 cm = volume: 118 mL. No fibroids or
other mass visualized.

Endometrium

Thickness: 0.9 cm.  No focal abnormality visualized.

Right ovary

Measurements: 9.2 x 5.3 x 7.3 cm = volume: 186 mL. Two large cystic
structures involving the right ovary. One structure measures 3.6 x
4.1 x 4.4 cm and the other measures 4.5 x 3.8 x 3.9 cm. There
appears to be large septation or ovarian tissue separating these two
large cystic structures. There is arterial and venous flow in the
tissue surrounding the large cystic structures. The large cystic
structures are relatively simple.

Left ovary

Measurements: 3.1 x 1.3 x 2.4 cm = volume: 4.9 mL. Normal
appearance/no adnexal mass.

Pulsed Doppler evaluation of both ovaries demonstrates normal
low-resistance arterial and venous waveforms.

Other findings

Trace free fluid.
IMPRESSION: 1. Right ovary is markedly enlarged and contains two large cystic
structures or cystic components. The cysts appear to be relatively
simple. Due to the large size of the right ovary, consider
gynecology consultation with regards to management.
2. No evidence for ovarian torsion. There is arterial and venous
flow in both ovaries.
3. Normal appearance of the uterus.

## 2021-02-10 IMAGING — US US TRANSVAGINAL NON-OB
1 series · 15 of 25 positions shown · non-contrast
Comparison: 06/18/2019

CLINICAL DATA: RIGHT ovarian cyst; LMP 06/16/2019

EXAM:
ULTRASOUND PELVIS TRANSVAGINAL
TECHNIQUE: Transvaginal ultrasound examination of the pelvis was performed
including evaluation of the uterus, ovaries, adnexal regions, and
pelvic cul-de-sac.

[Series 1: us transvaginal non-ob · 36 acquisitions, 15 frames shown]
[im 1/36]
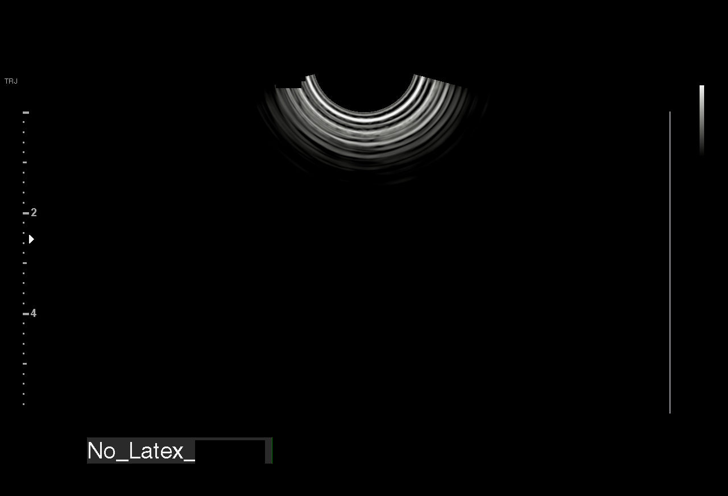
[im 3/36]
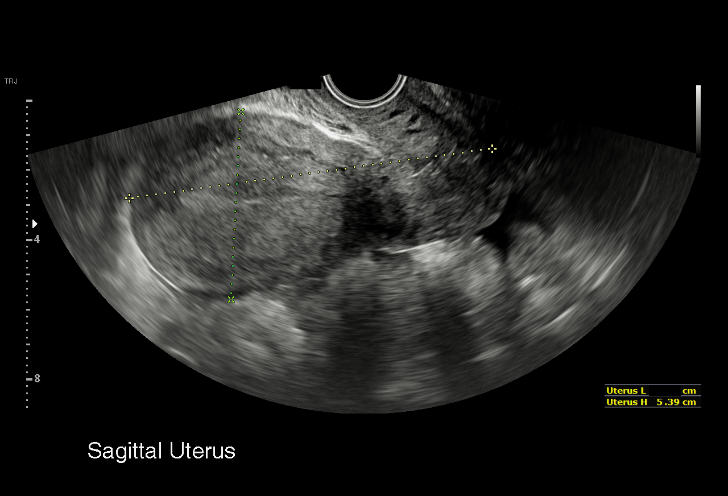
[im 6/36]
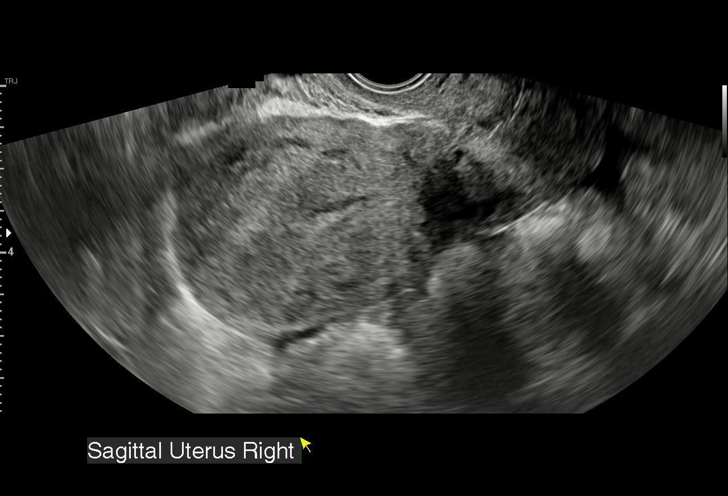
[im 8/36]
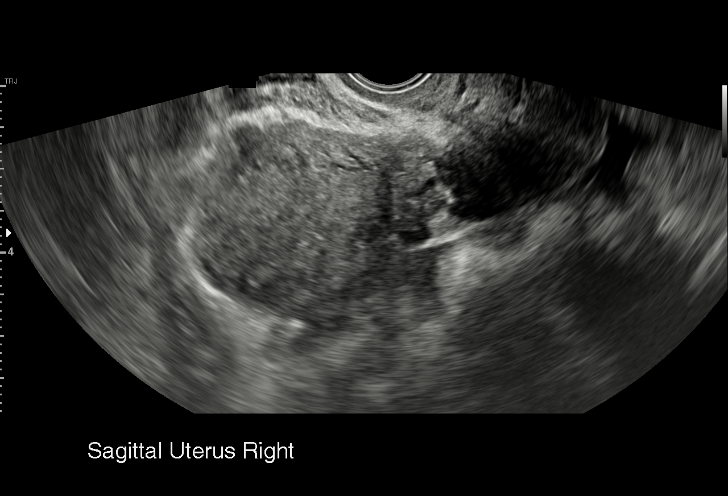
[im 11/36]
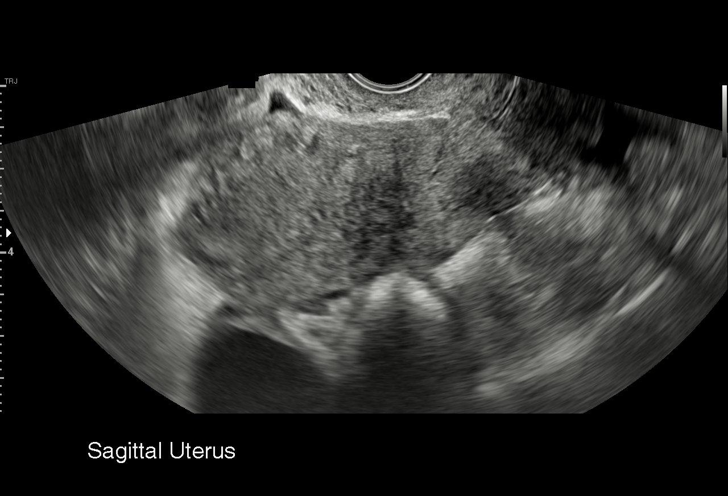
[im 14/36]
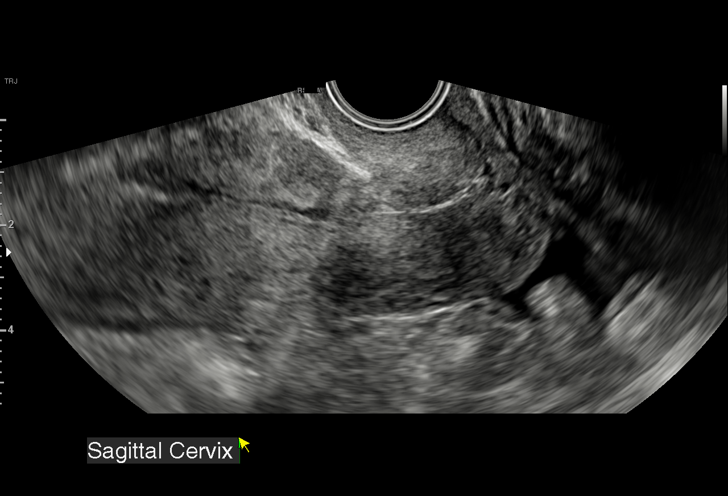
[im 15/36]
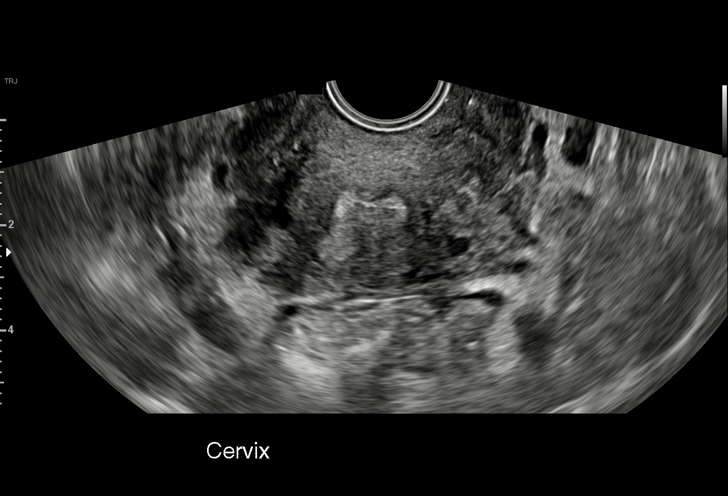
[im 18/36]
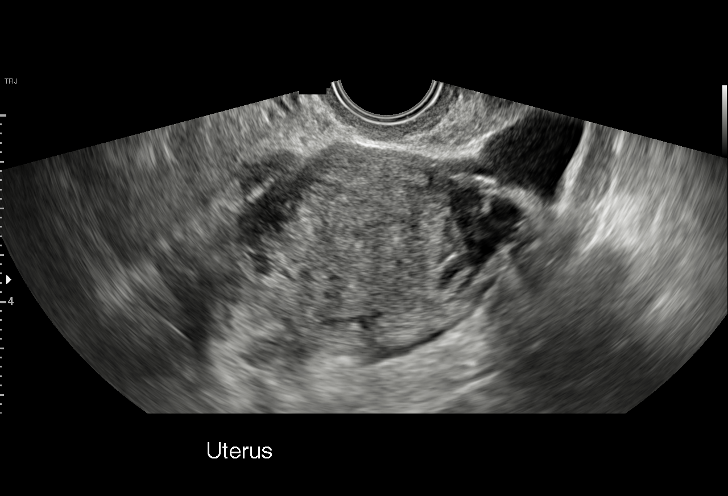
[im 21/36]
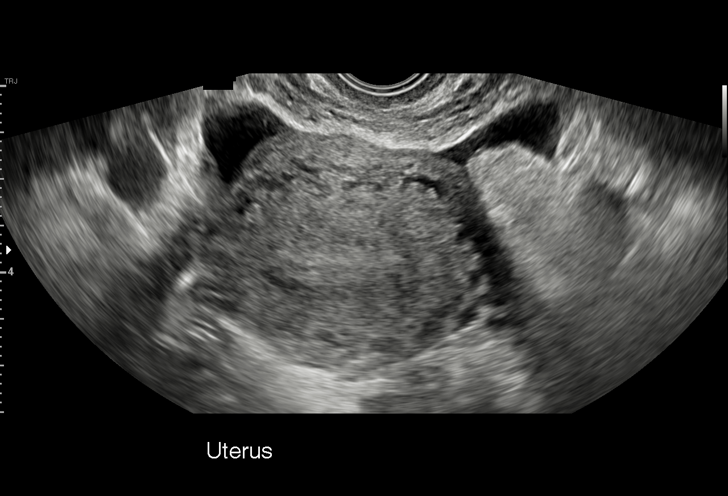
[im 22/36]
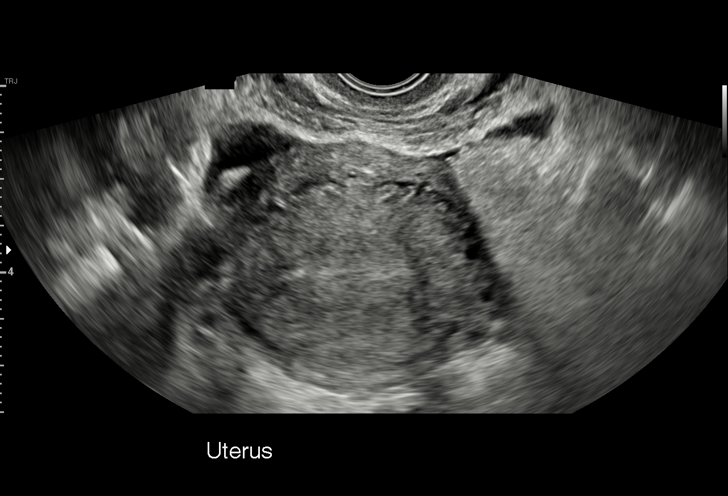
[im 25/36]
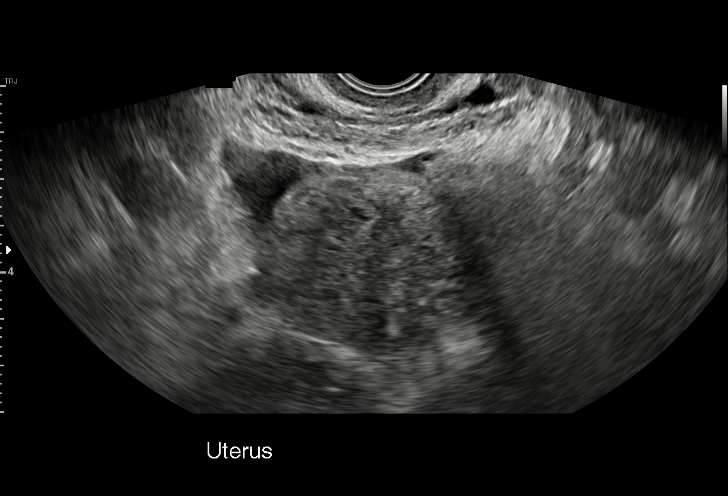
[im 28/36]
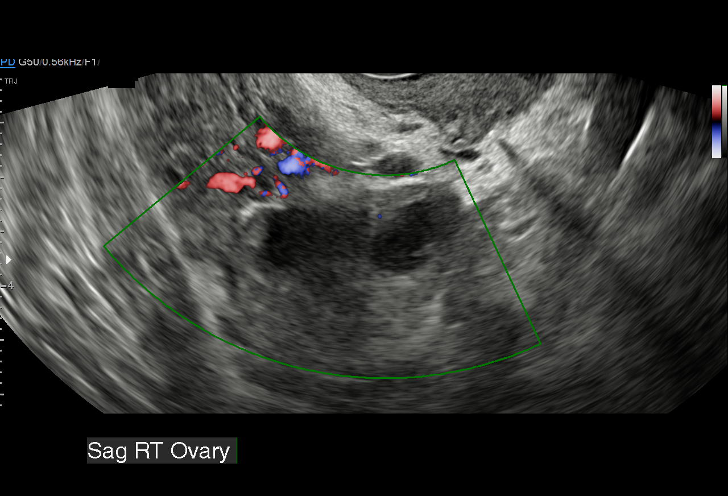
[im 30/36]
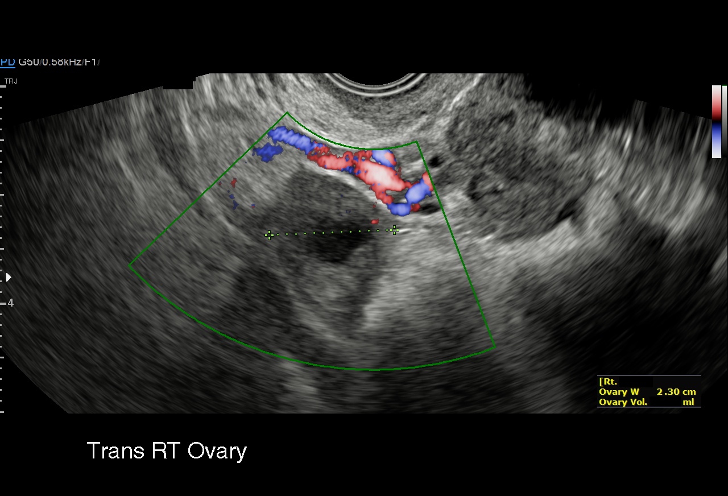
[im 33/36]
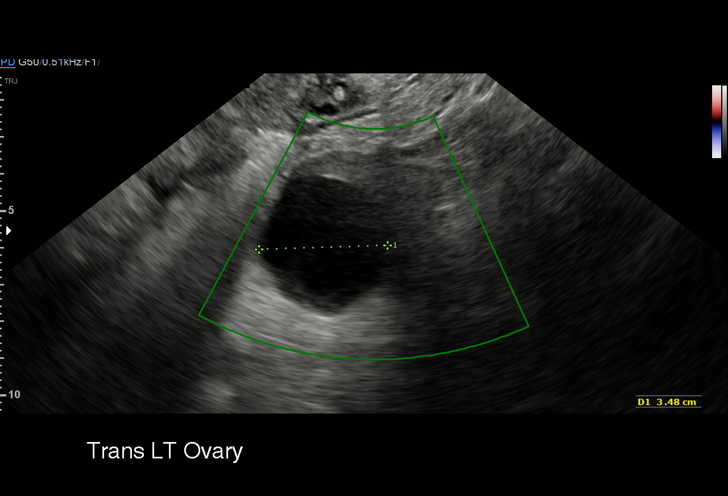
[im 36/36]
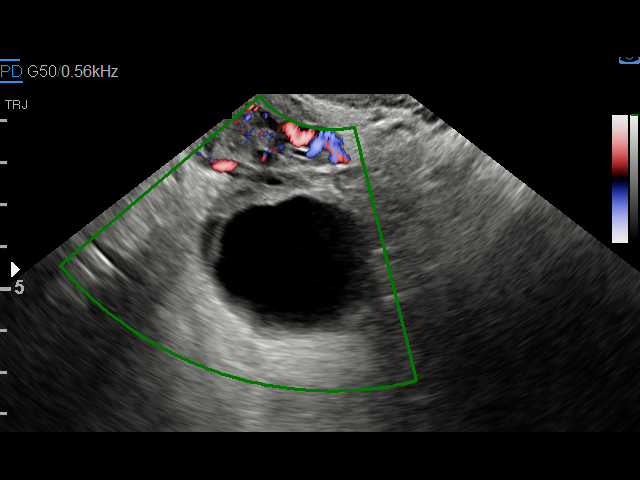

[15 of 25 positions shown; findings below may reference images not displayed]

FINDINGS: Uterus

Measurements: 10.5 x 5.4 x 5.7 cm = volume: 168 mL. Mildly
heterogeneous myometrium. No focal mass.

Endometrium

Thickness: 5 mm.  No endometrial fluid or focal abnormality

Right ovary

Measurements: 4.5 x 2.7 x 2.3 cm = volume: 14.3 mL. Normal
morphology without mass. Residual dominant follicles. Complicated
mass seen on previous exam resolved.

Left ovary

Measurements: 4.6 x 4.3 x 4.2 cm = volume: 43.0 mL. Simple cyst
x 3.6 x 3.5 cm. No additional masses.

Other findings: Small amount of free pelvic fluid. No other adnexal
masses identified.
IMPRESSION: Complicated RIGHT ovarian cyst identified on the previous exam
resolved.

Simple cyst LEFT ovary 3.7 cm diameter.

## 2022-08-05 ENCOUNTER — Other Ambulatory Visit: Payer: Self-pay

## 2022-08-05 ENCOUNTER — Ambulatory Visit (HOSPITAL_COMMUNITY)
Admission: EM | Admit: 2022-08-05 | Discharge: 2022-08-05 | Disposition: A | Discharge status: Home / Self Care | Payer: BLUE CROSS/BLUE SHIELD

## 2022-08-05 ENCOUNTER — Ambulatory Visit (INDEPENDENT_AMBULATORY_CARE_PROVIDER_SITE_OTHER): Payer: BLUE CROSS/BLUE SHIELD

## 2022-08-05 ENCOUNTER — Encounter (HOSPITAL_COMMUNITY): Payer: Self-pay | Admitting: Emergency Medicine

## 2022-08-05 DIAGNOSIS — R079 Chest pain, unspecified: Secondary | ICD-10-CM

## 2022-08-05 MED ORDER — ALBUTEROL SULFATE HFA 108 (90 BASE) MCG/ACT IN AERS: 1.0000 | INHALATION_SPRAY | Freq: Four times a day (QID) | RESPIRATORY_TRACT | 0 refills | Status: AC | PRN

## 2022-08-05 NOTE — ED Provider Notes (Signed)
[facility]    CSN: [id number] Arrival date & time: 08/05/22  1649      History   Chief Complaint Chief Complaint  Patient presents with   Chest Pain    HPI Christina Vega is a 37 y.o. female.   Patient presents with chest pain that started about a week ago and has been intermittent.  Currently is experiencing chest pain that started around 6 PM.  Reports that she has pain with inspiration, shortness of breath, coughing when the chest pain occurs.  Patient reports that there is typically a strong smell prior to the chest pain starting which exacerbates it.  Denies any history of cardiac issues or any lung problems.  Pain is located in the center and seems to radiate to the right.  Has not taken any medications for pain.  Denies any associated upper respiratory symptoms or fever.   Chest Pain   History reviewed. No pertinent past medical history.  Patient Active Problem List   Diagnosis Date Noted   Nexplanon in place 07/01/2019   Ovarian cyst 07/01/2019   Syncope 08/24/2015   Hypoesthesia of skin 07/29/2014   Nightmares 07/05/2014   Pain in surgical scar 07/04/2014   Refugee health examination 07/04/2014   Language barrier: Primary language - [place]. Needs interpreter 07/04/2014    Past Surgical History:  Procedure Laterality Date   CESAREAN SECTION      OB History     Gravida  5   Para      Term      Preterm      AB      Living  4      SAB      IAB      Ectopic      Multiple      Live Births  4            Home Medications    Prior to Admission medications   Medication Sig Start Date End Date Taking? Authorizing Provider  albuterol (VENTOLIN HFA) 108 (90 Base) MCG/ACT inhaler Inhale 1-2 puffs into the lungs every 6 (six) hours as needed for wheezing or shortness of breath. 08/05/22  Yes Wyoma Genson, Rolly Salter E, FNP  cholecalciferol (VITAMIN D3) 25 MCG (1000 UNIT) tablet Take 1,000 Units by mouth daily.   Yes [provider]   ibuprofen (ADVIL) 600 MG tablet Take 1 tablet (600 mg total) by mouth every 6 (six) hours as needed for up to 30 doses for mild pain or moderate pain. 06/18/19   Terald Sleeper, MD  ondansetron (ZOFRAN ODT) 4 MG disintegrating tablet Take 1 tablet (4 mg total) by mouth every 6 (six) hours as needed. Patient not taking: Reported on 08/05/2022 06/19/19   Ward, Layla Maw, DO  oxyCODONE-acetaminophen (PERCOCET) 7.5-325 MG tablet Take 1 tablet by mouth every 8 (eight) hours as needed for up to 5 doses for severe pain. Patient not taking: Reported on 08/05/2022 06/18/19   Terald Sleeper, MD    Family History History reviewed. No pertinent family history.  Social History Social History   Tobacco Use   Smoking status: Never   Smokeless tobacco: Never  Vaping Use   Vaping Use: Never used  Substance Use Topics   Alcohol use: No   Drug use: No     Allergies   Patient has no known allergies.   Review of Systems Review of Systems Per HPI  Physical Exam Triage Vital Signs ED Triage Vitals  Enc Vitals Group  BP 08/05/22 1910 118/76     Pulse Rate 08/05/22 1910 74     Resp 08/05/22 1910 20     Temp 08/05/22 1910 98.5 F (36.9 C)     Temp Source 08/05/22 1910 Oral     SpO2 08/05/22 1910 98 %     Weight --      Height --      Head Circumference --      Peak Flow --      Pain Score 08/05/22 1907 6     Pain Loc --      Pain Edu? --      Excl. in GC? --    No data found.  Updated Vital Signs BP 118/76 (BP Location: Right Arm) Comment (BP Location): large cuff  Pulse 74   Temp 98.5 F (36.9 C) (Oral)   Resp 20   SpO2 98%   Visual Acuity Right Eye Distance:   Left Eye Distance:   Bilateral Distance:    Right Eye Near:   Left Eye Near:    Bilateral Near:     Physical Exam Constitutional:      General: She is not in acute distress.    Appearance: Normal appearance. She is not toxic-appearing or diaphoretic.  HENT:     Head: Normocephalic and atraumatic.   Eyes:     Extraocular Movements: Extraocular movements intact.     Conjunctiva/sclera: Conjunctivae normal.  Cardiovascular:     Rate and Rhythm: Normal rate and regular rhythm.     Pulses: Normal pulses.     Heart sounds: Normal heart sounds.  Pulmonary:     Effort: Pulmonary effort is normal. No respiratory distress.     Breath sounds: Normal breath sounds. No stridor. No wheezing, rhonchi or rales.  Chest:     Chest wall: No tenderness.  Neurological:     General: No focal deficit present.     Mental Status: She is alert and oriented to person, place, and time. Mental status is at baseline.  Psychiatric:        Mood and Affect: Mood normal.        Behavior: Behavior normal.        Thought Content: Thought content normal.        Judgment: Judgment normal.      UC Treatments / Results  Labs (all labs ordered are listed, but only abnormal results are displayed) Labs Reviewed - No data to display  EKG   Radiology DG Chest 2 View  Result Date: 08/05/2022 CLINICAL DATA:  Chest pain. EXAM: CHEST - 2 VIEW COMPARISON:  None Available. FINDINGS: Clear lungs. Normal heart size and mediastinal contours. No pleural effusion or pneumothorax. Visualized bones and upper abdomen are unremarkable. IMPRESSION: No evidence of acute cardiopulmonary disease. Electronically Signed   By: Orvan Falconer M.D.   On: 08/05/2022 19:50    Procedures Procedures (including critical care time)  Medications Ordered in UC Medications - No data to display  Initial Impression / Assessment and Plan / UC Course  I have reviewed the triage vital signs and the nursing notes.  Pertinent labs & imaging results that were available during my care of the patient were reviewed by me and considered in my medical decision making (see chart for details).     EKG completed that was sinus rhythm with sinus arrhythmia which is most likely normal variant in this patient's age group and no acute abnormalities.   Chest x-ray completed that was negative for  any acute cardiopulmonary process.  There is low concern for cardiac etiology or need for emergent evaluation as patient's HEAR score is 0.  Suspect possible bronchospasm given that chest pain and discomfort occurs with a strong smell.  Will prescribe albuterol for patient to take as needed for this.  Suggested basic blood work but patient declined this.  Advised strict ER precautions if symptoms persist or worsen.  Patient verbalized understanding and was agreeable with plan.  Patient declined interpreter throughout patient interaction. Final Clinical Impressions(s) / UC Diagnoses   Final diagnoses:  Chest pain, unspecified type     Discharge Instructions      X-ray and EKG were normal.  I have prescribed an inhaler for you to take as needed for chest pain as I do think it is related to reaction to your lungs.  Follow-up with the ER if symptoms persist or worsen.     ED Prescriptions     Medication Sig Dispense Auth. Provider   albuterol (VENTOLIN HFA) 108 (90 Base) MCG/ACT inhaler Inhale 1-2 puffs into the lungs every 6 (six) hours as needed for wheezing or shortness of breath. 1 each Gustavus Bryant, [place]      PDMP not reviewed this encounter.   Gustavus Bryant, [place] 08/05/22 2021

## 2022-08-05 NOTE — Discharge Instructions (Signed)
X-ray and EKG were normal.  I have prescribed an inhaler for you to take as needed for chest pain as I do think it is related to reaction to your lungs.  Follow-up with the ER if symptoms persist or worsen.

## 2022-08-05 NOTE — ED Triage Notes (Signed)
Pain is intermittent in upper chest for 4 days.  Pain prevents patient from taking deep breaths.  Reports chest feels tight and knife - like pain in chest and upper back.  Movement of right arm makes pain in chest worse  Has not had any medications

## 2022-08-05 NOTE — ED Notes (Signed)
Patient in xray 

## 2023-05-10 ENCOUNTER — Encounter (HOSPITAL_COMMUNITY): Payer: Self-pay

## 2023-05-10 ENCOUNTER — Inpatient Hospital Stay (HOSPITAL_COMMUNITY)
Admission: AD | Admit: 2023-05-10 | Discharge: 2023-05-10 | Disposition: A | Discharge status: Home / Self Care | Payer: Self-pay | Attending: Obstetrics & Gynecology | Admitting: Obstetrics & Gynecology

## 2023-05-10 ENCOUNTER — Ambulatory Visit (HOSPITAL_COMMUNITY)
Admission: EM | Admit: 2023-05-10 | Discharge: 2023-05-10 | Disposition: A | Discharge status: Home / Self Care | Payer: Self-pay | Attending: Internal Medicine | Admitting: Internal Medicine

## 2023-05-10 ENCOUNTER — Inpatient Hospital Stay (HOSPITAL_COMMUNITY): Payer: Self-pay

## 2023-05-10 ENCOUNTER — Encounter (HOSPITAL_COMMUNITY): Payer: Self-pay | Admitting: Obstetrics & Gynecology

## 2023-05-10 DIAGNOSIS — O09521 Supervision of elderly multigravida, first trimester: Secondary | ICD-10-CM | POA: Insufficient documentation

## 2023-05-10 DIAGNOSIS — N760 Acute vaginitis: Secondary | ICD-10-CM

## 2023-05-10 DIAGNOSIS — O4690 Antepartum hemorrhage, unspecified, unspecified trimester: Secondary | ICD-10-CM

## 2023-05-10 DIAGNOSIS — B9689 Other specified bacterial agents as the cause of diseases classified elsewhere: Secondary | ICD-10-CM | POA: Insufficient documentation

## 2023-05-10 DIAGNOSIS — O209 Hemorrhage in early pregnancy, unspecified: Secondary | ICD-10-CM

## 2023-05-10 DIAGNOSIS — O26899 Other specified pregnancy related conditions, unspecified trimester: Secondary | ICD-10-CM

## 2023-05-10 DIAGNOSIS — O23591 Infection of other part of genital tract in pregnancy, first trimester: Secondary | ICD-10-CM | POA: Insufficient documentation

## 2023-05-10 DIAGNOSIS — O200 Threatened abortion: Secondary | ICD-10-CM | POA: Insufficient documentation

## 2023-05-10 DIAGNOSIS — Z3A10 10 weeks gestation of pregnancy: Secondary | ICD-10-CM | POA: Insufficient documentation

## 2023-05-10 HISTORY — DX: Unspecified asthma, uncomplicated: J45.909

## 2023-05-10 LAB — URINALYSIS, ROUTINE W REFLEX MICROSCOPIC
Bilirubin Urine: NEGATIVE
Glucose, UA: NEGATIVE mg/dL
Ketones, ur: NEGATIVE mg/dL
Nitrite: NEGATIVE
Protein, ur: 30 mg/dL — AB
Specific Gravity, Urine: 1.005 (ref 1.005–1.030)
pH: 6 (ref 5.0–8.0)

## 2023-05-10 LAB — ABO/RH: ABO/RH(D): O POS

## 2023-05-10 LAB — CBC
HCT: 35 % — ABNORMAL LOW (ref 36.0–46.0)
Hemoglobin: 11.4 g/dL — ABNORMAL LOW (ref 12.0–15.0)
MCH: 27.8 pg (ref 26.0–34.0)
MCHC: 32.6 g/dL (ref 30.0–36.0)
MCV: 85.4 fL (ref 80.0–100.0)
Platelets: 389 10*3/uL (ref 150–400)
RBC: 4.1 MIL/uL (ref 3.87–5.11)
RDW: 12.3 % (ref 11.5–15.5)
WBC: 9.9 10*3/uL (ref 4.0–10.5)
nRBC: 0 % (ref 0.0–0.2)

## 2023-05-10 LAB — WET PREP, GENITAL
Sperm: NONE SEEN
Trich, Wet Prep: NONE SEEN
WBC, Wet Prep HPF POC: <10 (ref <10)
Yeast Wet Prep HPF POC: NONE SEEN

## 2023-05-10 LAB — POCT URINE PREGNANCY: Preg Test, Ur: POSITIVE — AB

## 2023-05-10 LAB — HCG, QUANTITATIVE, PREGNANCY: hCG, Beta Chain, Quant, S: 9455 m[IU]/mL — ABNORMAL HIGH (ref <5)

## 2023-05-10 MED ORDER — METRONIDAZOLE 500 MG PO TABS: 500.0000 mg | ORAL_TABLET | Freq: Two times a day (BID) | ORAL | 0 refills | Status: AC

## 2023-05-10 NOTE — Discharge Instructions (Signed)
Pregnancy test is positive and given the abdominal pain and vaginal bleeding, we recommend further evaluation at the [facility] emergency room

## 2023-05-10 NOTE — ED Triage Notes (Signed)
Pt is 2 months pregnant. The patient stated having abdominal pain last night which increased this morning and through today. The patient also started having dark vaginal bleeding this morning. The pt also started having left leg numbness at 1400 today as well.

## 2023-05-10 NOTE — Discharge Instructions (Signed)

## 2023-05-10 NOTE — ED Notes (Signed)
Patient is being discharged from the Urgent Care and sent to the MAU via POV . Per Landis Martins, Gid Schoffstall ,patient is in need of higher level of care due to a positive pregnancy test, lower abdominal pain, and vaginal bleeding. Patient is aware and verbalizes understanding of plan of care.  Vitals:   05/10/23 1647  BP: 106/73  Pulse: 75  Resp: 18  SpO2: 98%

## 2023-05-10 NOTE — MAU Note (Signed)
.  Christina Vega is a 38 y.o. at Unknown here in MAU reporting: Abdominal pain and vaginal bleeding. She reports initially her pains began last night and when she woke up this morning she noted vaginal bleeding. She reports she just placed a pad on when she was in the emergency room a few moments ago and has not looked at it yet. She reports she is mostly concerned about her abdominal pain. She reports her pains feel like contractions and she reports the pains are wrapping around into her back. Denies vaginal odors. Denies fevers. Denies recent IC.  LMP: 02/25/2023 Onset of complaint: Last night Pain score: 9/10 lower back and lower abdomen  Vitals:   05/10/23 1747  BP: 122/77  Pulse: 81  Resp: 16  Temp: 98.1 F (36.7 C)  SpO2: 99%     FHT: unable to doppler FHT Lab orders placed from triage: UA

## 2023-05-10 NOTE — MAU Provider Note (Signed)
Chief Complaint:  Abdominal Pain, Vaginal Bleeding, and Back Pain   HPI    Christina Vega is a 38 y.o. [id number] at [redacted]w[redacted]d who presents to maternity admissions reporting abdominal pain and vaginal bleeding. Denies recent IC or vaginal exams.Offers no c/o LOF, vaginal discomfort and LMP 02/25/23 - [redacted]w[redacted]d by LMP  with a Positive UPT   Pregnancy Course: un established  Past Medical History:  Diagnosis Date   Asthma    OB History  Gravida Para Term Preterm AB Living  6 5 4 1  0 4  SAB IAB Ectopic Multiple Live Births  0    5    # Outcome Date GA Lbr Len/2nd Weight Sex Type Anes PTL Lv  6 Current           5 Term 04/02/12    F Vag-Spont   LIV  4 Preterm 12/2011    M Vag-Spont   ND  3 Term 02/23/10    M CS-LTranv   LIV  2 Term 04/14/06    Judie Petit Vag-Spont   LIV  1 Term 02/22/05    Genella Mech   LIV   Past Surgical History:  Procedure Laterality Date   CESAREAN SECTION     History reviewed. No pertinent family history. Social History   Tobacco Use   Smoking status: Never   Smokeless tobacco: Never  Vaping Use   Vaping status: Never Used  Substance Use Topics   Alcohol use: No   Drug use: No   Allergies  Allergen Reactions   Pork-Derived Products     Cultural practice   No medications prior to admission.    I have reviewed patient's Past Medical Hx, Surgical Hx, Family Hx, Social Hx, medications and allergies.   ROS  Pertinent items noted in HPI and remainder of comprehensive ROS otherwise negative.   PHYSICAL EXAM  Patient Vitals for the past 24 hrs:  BP Temp Temp src Pulse Resp SpO2 Height Weight  05/10/23 2038 119/76 -- -- 90 -- -- -- --  05/10/23 1747 122/77 98.1 F (36.7 C) Oral 81 16 99 % 5\' 5"  (1.651 m) 103 kg    Constitutional: Well-developed, obese female in no acute distress.  Cardiovascular: normal rate & rhythm, warm and well-perfused Respiratory: normal effort, no problems with respiration noted GI: Abd soft, non-tender, gravid MS: Extremities nontender,  no edema, normal ROM Neurologic: Alert and oriented x 4.  GU: no CVA tenderness Pelvic: Deferred ( Self swabbed)     Fetal Tracing:  via Ultrasound report     Labs: Results for orders placed or performed during the hospital encounter of 05/10/23 (from the past 24 hours)  CBC     Status: Abnormal   Collection Time: 05/10/23  6:20 PM  Result Value Ref Range   WBC 9.9 4.0 - 10.5 K/uL   RBC 4.10 3.87 - 5.11 MIL/uL   Hemoglobin 11.4 (L) 12.0 - 15.0 g/dL   HCT 40.1 (L) 02.7 - 25.3 %   MCV 85.4 80.0 - 100.0 fL   MCH 27.8 26.0 - 34.0 pg   MCHC 32.6 30.0 - 36.0 g/dL   RDW 66.4 40.3 - 47.4 %   Platelets 389 150 - 400 K/uL   nRBC 0.0 0.0 - 0.2 %  ABO/Rh     Status: None   Collection Time: 05/10/23  6:20 PM  Result Value Ref Range   ABO/RH(D) O POS    No rh immune globuloin      NOT A RH IMMUNE  GLOBULIN CANDIDATE, PT RH POSITIVE Performed at [facility], [address] [place], [place] [address]   hCG, quantitative, pregnancy     Status: Abnormal   Collection Time: 05/10/23  6:20 PM  Result Value Ref Range   hCG, Beta Chain, Quant, S 9,455 (H) <5 mIU/mL  Wet prep, genital     Status: Abnormal   Collection Time: 05/10/23  6:32 PM  Result Value Ref Range   Yeast Wet Prep HPF POC NONE SEEN NONE SEEN   Trich, Wet Prep NONE SEEN NONE SEEN   Clue Cells Wet Prep HPF POC PRESENT (A) NONE SEEN   WBC, Wet Prep HPF POC <10 <10   Sperm NONE SEEN   Urinalysis, Routine w reflex microscopic -Urine, Random     Status: Abnormal   Collection Time: 05/10/23  8:32 PM  Result Value Ref Range   Color, Urine YELLOW YELLOW   APPearance HAZY (A) CLEAR   Specific Gravity, Urine 1.005 1.005 - 1.030   pH 6.0 5.0 - 8.0   Glucose, UA NEGATIVE NEGATIVE mg/dL   Hgb urine dipstick LARGE (A) NEGATIVE   Bilirubin Urine NEGATIVE NEGATIVE   Ketones, ur NEGATIVE NEGATIVE mg/dL   Protein, ur 30 (A) NEGATIVE mg/dL   Nitrite NEGATIVE NEGATIVE   Leukocytes,Ua SMALL (A) NEGATIVE   RBC / HPF 21-50 0 - 5  RBC/hpf   WBC, UA 11-20 0 - 5 WBC/hpf   Bacteria, UA MANY (A) NONE SEEN   Squamous Epithelial / HPF 11-20 0 - 5 /HPF   Crystals PRESENT (A) NEGATIVE    Imaging:  US OB LESS THAN 14 WEEKS WITH OB TRANSVAGINAL Result Date: 05/10/2023 CLINICAL DATA:  Abdominal pain, vaginal bleeding. Assigned gestational age [redacted] weeks, 4 days. EXAM: OBSTETRIC <14 WK [place] AND TRANSVAGINAL OB US TECHNIQUE: Both transabdominal and transvaginal ultrasound examinations were performed for complete evaluation of the gestation as well as the maternal uterus, adnexal regions, and pelvic cul-de-sac. Transvaginal technique was performed to assess early pregnancy. COMPARISON:  07/08/2019 FINDINGS: Intrauterine gestational sac: Single Yolk sac:  Visualized. Embryo:  Visualized. Cardiac Activity: Visualized. Heart Rate: 90 bpm CRL:  9 mm   7 w   0 d                  [place] EDC: 12/27/2023 Subchorionic hemorrhage: A small complex cystic collection is seen adjacent to the chorion measuring 13 mm which was not present on prior sonogram is most in keeping with a small subchorionic hemorrhage. Maternal uterus/adnexae: The cervix is closed and is unremarkable. No uterine masses. The maternal ovaries are unremarkable. Trace free fluid within the cul-de-sac IMPRESSION: Single living intrauterine gestation with an estimated gestational age of [redacted] weeks, 0 days. Fetal heart rate is below expected range given gestational age, a poor prognostic indicator. Follow-up of serial beta HCG examination and possible sonography is recommended in 7-10 days to document appropriate progression. Small subchronic hemorrhage Electronically Signed   By: Helyn Numbers M.D.   On: 05/10/2023 19:48    MDM & MAU COURSE  MDM:  HIGH  CBC ABO Bhcg OB Ultrasound - images reviewed and interpreted by me SIUP measuring ~ [redacted]w[redacted]d ( Less than expected GA by LMP) Swabs obtained C/W BV - RX sent  Patient counseled and anticipatory guidance provided regarding sono report -   Threatened MAB vs early pregnancy with dates < expected by LMP with a subchorionic hemorrhage  Plan for Strict Bleeding precautions and f/u Ultrasound in 7-10 days for  pregnancy progression    IMPRESSION: ( Copied from Radiology report)  Single living intrauterine gestation with an estimated gestational age of [redacted] weeks, 0 days. Fetal heart rate is below expected range given gestational age, a poor prognostic indicator.  A small complex cystic collection is seen adjacent to the chorion measuring 13 mm which was not present on prior sonogram is most in keeping with a small subchorionic hemorrhage.  I have reviewed the patient chart and performed the physical exam . I have ordered & interpreted the lab results and reviewed and interpreted the ultrasound images and results Medications ordered as stated below.  A/P as described below.  Counseling and education provided and patient agreeable  with plan as described below. Verbalized understanding.    MAU Course: Orders Placed This Encounter  Procedures   Wet prep, genital   Culture, OB Urine   US OB LESS THAN 14 WEEKS WITH OB TRANSVAGINAL   CBC   hCG, quantitative, pregnancy   Urinalysis, Routine w reflex microscopic -Urine, Random   Diet NPO time specified   ABO/Rh   Discharge patient Discharge disposition: 01-Home or Self Care; Discharge patient date: 05/10/2023     ASSESSMENT   1. Vaginal bleeding affecting early pregnancy   2. Abdominal cramping affecting pregnancy   3. [redacted] weeks gestation of pregnancy   4. Threatened miscarriage in early pregnancy   5. Bacterial vaginosis     PLAN  Discharge home in stable condition with return precautions.  F/U sono in 7-10 days as discussed above Strict bleeding precautions reviewed and patient and husband verbalized understanding    Allergies as of 05/10/2023       Reactions   Pork-derived Products    Cultural practice        Medication List     STOP taking these medications     ondansetron 4 MG disintegrating tablet Commonly known as: Zofran ODT   oxyCODONE-acetaminophen 7.5-325 MG tablet Commonly known as: PERCOCET       TAKE these medications    albuterol 108 (90 Base) MCG/ACT inhaler Commonly known as: VENTOLIN HFA Inhale 1-2 puffs into the lungs every 6 (six) hours as needed for wheezing or shortness of breath.   cholecalciferol 25 MCG (1000 UNIT) tablet Commonly known as: VITAMIN D3 Take 1,000 Units by mouth daily.   ibuprofen 600 MG tablet Commonly known as: ADVIL Take 1 tablet (600 mg total) by mouth every 6 (six) hours as needed for up to 30 doses for mild pain or moderate pain.   metroNIDAZOLE 500 MG tablet Commonly known as: FLAGYL Take 1 tablet (500 mg total) by mouth 2 (two) times daily.       Marcell Barlow, MSN, [facility], [facility] [employer]

## 2023-05-10 NOTE — ED Provider Notes (Signed)
38 year old female who is briefly seen in triage with abdominal pain and vaginal bleeding.  She reports that she has tested positive for pregnancy.  The vaginal bleeding started this morning.  Vaginal bleeding is dark.  Her urine pregnancy test here is positive and therefore we recommend further evaluation at the [facility] emergency room for pregnancy with vaginal bleeding.   Landis Martins, [place] 05/10/23 1707

## 2023-05-12 LAB — CULTURE, OB URINE: Culture: NO GROWTH

## 2023-05-12 LAB — GC/CHLAMYDIA PROBE AMP (~~LOC~~) NOT AT ARMC
Chlamydia: NEGATIVE
Comment: NEGATIVE
Comment: NORMAL
Neisseria Gonorrhea: NEGATIVE

## 2023-05-14 NOTE — Progress Notes (Signed)
Urine C/S Negative

## 2024-02-26 ENCOUNTER — Other Ambulatory Visit: Payer: Self-pay

## 2024-02-26 DIAGNOSIS — Z36 Encounter for antenatal screening for chromosomal anomalies: Secondary | ICD-10-CM

## 2024-03-31 ENCOUNTER — Encounter: Payer: Self-pay | Admitting: *Deleted

## 2024-03-31 DIAGNOSIS — O285 Abnormal chromosomal and genetic finding on antenatal screening of mother: Secondary | ICD-10-CM | POA: Insufficient documentation

## 2024-03-31 DIAGNOSIS — R030 Elevated blood-pressure reading, without diagnosis of hypertension: Secondary | ICD-10-CM | POA: Insufficient documentation

## 2024-03-31 DIAGNOSIS — O09522 Supervision of elderly multigravida, second trimester: Secondary | ICD-10-CM | POA: Insufficient documentation

## 2024-03-31 DIAGNOSIS — J45909 Unspecified asthma, uncomplicated: Secondary | ICD-10-CM | POA: Insufficient documentation

## 2024-03-31 DIAGNOSIS — O34219 Maternal care for unspecified type scar from previous cesarean delivery: Secondary | ICD-10-CM | POA: Insufficient documentation

## 2024-03-31 DIAGNOSIS — Z148 Genetic carrier of other disease: Secondary | ICD-10-CM | POA: Insufficient documentation

## 2024-03-31 DIAGNOSIS — Z98891 History of uterine scar from previous surgery: Secondary | ICD-10-CM | POA: Insufficient documentation

## 2024-04-13 ENCOUNTER — Ambulatory Visit: Payer: Self-pay | Admitting: Obstetrics and Gynecology

## 2024-04-13 ENCOUNTER — Ambulatory Visit: Payer: Self-pay

## 2024-04-13 ENCOUNTER — Ambulatory Visit: Payer: Self-pay | Attending: Obstetrics and Gynecology

## 2024-04-13 VITALS — BP 114/63 | HR 87

## 2024-04-13 DIAGNOSIS — O99212 Obesity complicating pregnancy, second trimester: Secondary | ICD-10-CM | POA: Insufficient documentation

## 2024-04-13 DIAGNOSIS — O99891 Other specified diseases and conditions complicating pregnancy: Secondary | ICD-10-CM | POA: Diagnosis not present

## 2024-04-13 DIAGNOSIS — O09522 Supervision of elderly multigravida, second trimester: Secondary | ICD-10-CM

## 2024-04-13 DIAGNOSIS — O34219 Maternal care for unspecified type scar from previous cesarean delivery: Secondary | ICD-10-CM | POA: Diagnosis not present

## 2024-04-13 DIAGNOSIS — O132 Gestational [pregnancy-induced] hypertension without significant proteinuria, second trimester: Secondary | ICD-10-CM | POA: Diagnosis not present

## 2024-04-13 DIAGNOSIS — Z148 Genetic carrier of other disease: Secondary | ICD-10-CM

## 2024-04-13 DIAGNOSIS — Z3A24 24 weeks gestation of pregnancy: Secondary | ICD-10-CM | POA: Insufficient documentation

## 2024-04-13 DIAGNOSIS — O3482 Maternal care for other abnormalities of pelvic organs, second trimester: Secondary | ICD-10-CM | POA: Insufficient documentation

## 2024-04-13 DIAGNOSIS — Z3A21 21 weeks gestation of pregnancy: Secondary | ICD-10-CM | POA: Diagnosis not present

## 2024-04-13 DIAGNOSIS — Z3492 Encounter for supervision of normal pregnancy, unspecified, second trimester: Secondary | ICD-10-CM

## 2024-04-13 DIAGNOSIS — Z36 Encounter for antenatal screening for chromosomal anomalies: Secondary | ICD-10-CM | POA: Diagnosis not present

## 2024-04-13 DIAGNOSIS — J45909 Unspecified asthma, uncomplicated: Secondary | ICD-10-CM | POA: Insufficient documentation

## 2024-04-13 DIAGNOSIS — Z363 Encounter for antenatal screening for malformations: Secondary | ICD-10-CM | POA: Diagnosis not present

## 2024-04-13 NOTE — Progress Notes (Signed)
 "    [facility] at [facility] [address], [address] Phone:  [phone]   Fax:  [phone]      In-Person Genetic Counseling Clinic Note:   I spoke with 39 y.o. Christina Vega today to discuss her carrier screening results. She was referred by Christina Parson, FNP. She was accompanied by her husband Christina Vega.   Pregnancy History:    [id number]. EGA: 243d by LMP. EDD: 07/31/2024. Christina Vega has four healthy children. Her son passed at 56 weeks of age due to unknown causes; she states he was born premature. She had one SAB of unknown etiology. Denies major personal health concerns. Denies bleeding, infections, and fevers in this pregnancy. Denies using tobacco, alcohol, or street drugs in this pregnancy.   Family History:    A three-generation pedigree was created and scanned into Epic under the Media tab.  Patient reports three of her siblings died as infants, causes unknown. We reviewed infant deaths can be due to genetics or several other factors. Without additional information, recurrence risk is limited.  Patient ethnicity reported as [place] and Christina Vega ethnicity reported as [place]ese. Denies Ashkenazi Jewish ancestry.  Family history not remarkable for consanguinity, individuals with birth defects, intellectual disability, autism spectrum disorder, multiple spontaneous abortions, still births, or unexplained neonatal death.   Increased Risk to be a Silent Carrier for Spinal Muscular Atrophy (SMA):  Christina Vega was found to be at an increased risk to be a silent carrier for SMA through carrier screening. 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 The laboratory identified the variant [id number] in Christina Vega 's screening, meaning there is increased risk for Christina Vega 's two  copies of the SMN1 gene to be on the same chromosome.  We reviewed the natural history of SMA, the SMN1 genes, and the autosomal recessive mode of inheritance of the condition. We reviewed that if Christina Vega was a silent SMA carrier, she would either pass down the chromosome with both copies of the SMN1 gene OR the chromosome with the deletions. If Christina Vega 's reproductive partner is found to also be a carrier for SMA, there would be a 25% risk for the current pregnancy and all future pregnancies the couple has together to be affected (have zero functional copies of the SMN1 gene).  Given Christina Vega's carrier screening results, we discussed and offered carrier screening for her reproductive partner. We reviewed the benefits and limitations of carrier screening and that it can detect most but not all carriers. We discussed that if both members of a couple are carriers for the same condition, prenatal diagnostic testing is avaliable to determine if the pregnancy is affected. We briefly reviewed the benefits and limitations of diagnostic testing including the 1 in 500 increased risk for miscarriage from the procedure. If diagnostic testing via amniocentesis is not desired, SMA testing can be completed after birth and is included in Christina Vega 's Newborn Screening Program. Christina Vega elected carrier screening. A saliva sample was collected today, and he consented we call Connelly with his results.  Of note, Christina Vega was not found to be a carrier for the other three conditions screened for (alpha thalassemia, beta-hemoglobinopathies, and CF). This significantly reduces but does not eliminate the chance of being a carrier. Please see report for residual risk information.    Newborn Screening. The [place] New[facility] Screening (NBS) program will screen all newborn babies for cystic fibrosis, spinal muscular atrophy, hemoglobinopathies, and numerous other conditions.  Advanced Maternal Age:  We briefly discussed that the  chance that a fetus would be affected with a chromosome difference increases with advanced maternal age. Ashanti's current age-related risk to have a pregnancy affected with a chromosome difference is approximately 1 in 105 (~1%). We discussed that this risk may also be lower given her low-risk NIPS results.  Previous Testing Completed:  Low risk NIPS: Marlane previously completed [facility] noninvasive prenatal screening (NIPS) in this pregnancy. The result is low risk, consistent with a female fetus. This screening significantly reduces but does not eliminate the chance that the current pregnancy has Down syndrome (trisomy 55), trisomy 53, trisomy 4, common sex chromosome conditions, and 22q11.2 microdeletion syndrome. Please see report for residual risk information. There are many genetic conditions that cannot be detected by NIPS.    Plan of Care:   Christina Vega carrier screening for SMA collected today. We will communicate the results to the patient. Declined amniocentesis. Follow-up [facility] ultrasound on 05/20/2024.   Informed consent was obtained. All questions were answered.   55 minutes were spent on the date of the encounter in service to the patient including preparation, face-to-face consultation, discussion of test reports and available next steps, pedigree construction, genetic risk assessment, documentation, and care coordination.    Thank you for sharing in the care of Christina Vega with us .  Please do not hesitate to contact us  at [phone] if you have any questions.   Christina Bodily, MS, [facility] Certified Genetic Counselor   Genetic counseling student involved in appointment: No. "

## 2024-04-13 NOTE — Progress Notes (Signed)
 After review, MFM consult with provider is not indicated for today  Arna Ranks, MD 04/13/2024 3:32 PM  [facility]

## 2024-05-20 ENCOUNTER — Other Ambulatory Visit

## 2024-05-20 ENCOUNTER — Ambulatory Visit
